# Patient Record
Sex: Female | Born: 2000 | Hispanic: No | Marital: Single | Smoking: Former smoker
Health system: Southern US, Community
[De-identification: ages and names within clinical notes are randomized; demographics above are authoritative.]

## PROBLEM LIST (undated history)

## (undated) DIAGNOSIS — R12 Heartburn: Secondary | ICD-10-CM

## (undated) DIAGNOSIS — J40 Bronchitis, not specified as acute or chronic: Secondary | ICD-10-CM

## (undated) DIAGNOSIS — F32A Depression, unspecified: Secondary | ICD-10-CM

## (undated) DIAGNOSIS — F1910 Other psychoactive substance abuse, uncomplicated: Secondary | ICD-10-CM

## (undated) DIAGNOSIS — K589 Irritable bowel syndrome without diarrhea: Secondary | ICD-10-CM

## (undated) DIAGNOSIS — F419 Anxiety disorder, unspecified: Secondary | ICD-10-CM

## (undated) DIAGNOSIS — J302 Other seasonal allergic rhinitis: Secondary | ICD-10-CM

## (undated) DIAGNOSIS — F329 Major depressive disorder, single episode, unspecified: Secondary | ICD-10-CM

## (undated) DIAGNOSIS — F909 Attention-deficit hyperactivity disorder, unspecified type: Secondary | ICD-10-CM

## (undated) HISTORY — DX: Heartburn: R12

## (undated) HISTORY — DX: Other psychoactive substance abuse, uncomplicated: F19.10

## (undated) HISTORY — DX: Depression, unspecified: F32.A

## (undated) HISTORY — PX: UPPER GASTROINTESTINAL ENDOSCOPY: SHX188

## (undated) HISTORY — DX: Anxiety disorder, unspecified: F41.9

## (undated) HISTORY — PX: WISDOM TOOTH EXTRACTION: SHX21

## (undated) HISTORY — DX: Major depressive disorder, single episode, unspecified: F32.9

## (undated) HISTORY — DX: Other seasonal allergic rhinitis: J30.2

## (undated) HISTORY — DX: Attention-deficit hyperactivity disorder, unspecified type: F90.9

## (undated) HISTORY — DX: Bronchitis, not specified as acute or chronic: J40

---
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
## 2018-01-09 ENCOUNTER — Ambulatory Visit (INDEPENDENT_AMBULATORY_CARE_PROVIDER_SITE_OTHER): Payer: Medicaid Other | Admitting: Internal Medicine

## 2018-01-09 ENCOUNTER — Encounter: Payer: Self-pay | Admitting: Internal Medicine

## 2018-01-09 ENCOUNTER — Encounter

## 2018-01-09 VITALS — BP 114/80 | HR 80 | Ht 65.0 in | Wt 259.1 lb

## 2018-01-09 DIAGNOSIS — R197 Diarrhea, unspecified: Secondary | ICD-10-CM | POA: Diagnosis not present

## 2018-01-09 DIAGNOSIS — R1013 Epigastric pain: Secondary | ICD-10-CM | POA: Diagnosis not present

## 2018-01-09 DIAGNOSIS — R630 Anorexia: Secondary | ICD-10-CM

## 2018-01-09 DIAGNOSIS — K625 Hemorrhage of anus and rectum: Secondary | ICD-10-CM | POA: Diagnosis not present

## 2018-01-09 NOTE — Patient Instructions (Signed)
  You have been scheduled for a colonoscopy. Please follow written instructions given to you at your visit today.  Please pick up your prep supplies at the pharmacy within the next 1-3 days. If you use inhalers (even only as needed), please bring them with you on the day of your procedure. Your physician has requested that you go to www.startemmi.com and enter the access code given to you at your visit today. This web site gives a general overview about your procedure. However, you should still follow specific instructions given to you by our office regarding your preparation for the procedure.   We are giving you samples to try of IBgard: take 2 capsules before meals. This is over the counter and we are giving you a coupon to use if you like it.   I appreciate the opportunity to care for you. Stan Head, MD, [facility]

## 2018-01-09 NOTE — Progress Notes (Addendum)
Renelda [place] 17 y.o. 02-02-2001 [id number]  Assessment & Plan:   Encounter Diagnoses  Name Primary?  . Diarrhea, unspecified type Yes  . Rectal bleeding   . Abdominal pain, epigastric   . Anorexia     Main ? Here is IBS vs IBD (Crohn's)  Hx and extensive record review undertaken -   I have recommended she undergo colonoscopy. Do not think other testing needed at this time. If colonoscopy negative (including random bxs liekly treat for IBS but consider capsule endoscopy Note that CT scan was done with IV but not oral contrast so bowel imaging less accurate  The risks and benefits as well as alternatives of endoscopic procedure(s) have been discussed and reviewed. All questions answered. The patient agrees to proceed.    Subjective:   Chief Complaint: diarrhea> constipation, nausea, rectal bleeding and abd pain  HPI 17 yo woman here w/ mom - has long hx GI problemns with nausea, vomiting and diarrhea sxs. Also rectal bleeding w/ wiping at times.10-14 stools./day - variable consistencies from watery to formed. No incontinence. Some nocturnal sxs - rare-uncommon. Having anorexia - using marijuana to help that.  Wt Readings from Last 3 Encounters:  01/09/18 259 lb 2 oz (117.5 kg) (>99 %, Z= 2.56)*  12/20/17 259 lb 14.8 oz (117.9 kg) (>99 %, Z= 2.57)*  02/14/17 260 lb (117.9 kg) (>99 %, Z= 2.52)*   * Growth percentiles are based on CDC (Girls, 2-20 Years) data.    Was in ED 11/2017  CT abd/pelvis 11/2017 - fatty liver o/w neg Labs 8 and 12/2017 CBC, CMET, UA, lipase - WBC 10-11, total protein sl high  05/2017 self harm visit - took Vyvanse - says she was trying to stay awake ansd not suicidal  Says since age [age] has had problems as above.  Records review after she left - 2016 - was in a group home then to try to help w/ ADHD and oppositiona d/o  CBC, CMET UA - gluc 169, o/w NL EGD 07/2014 - for heartburn, nausea, anorexia despite PPI  NL - NL rectal ane  heme neg then bxs - NL esophagus, stomach and duodenum Disaccharidase test - NL Tx Miralax, dicyclomine, omeprazole Sxs same as now though diarrhea seems worse now  Was on Tx for depression, ADHD (Celexa, Lamictal, Intuiv) No Known Allergies No outpatient medications have been marked as taking for the 01/09/18 encounter (Office Visit) with Iva Boop, MD.   Past Medical History:  Diagnosis Date  . ADHD   . Bronchitis   . Depression   . Heartburn   . IBS (irritable bowel syndrome)   . Seasonal allergies    Past Surgical History:  Procedure Laterality Date  . ESOPHAGOGASTRODUODENOSCOPY  2016   Normal  . 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 ENT:   Mouth and posterior pharynx free of lesions.  Neck:   supple w/o thyromegaly or mass.  Lungs: Clear to auscultation bilaterally. Heart:  S1S2, no rubs, murmurs, gallops. Abdomen:  soft, non-tender, no hepatosplenomegaly, hernia, or mass and BS+.  Rectal: deferred Lymph:  no cervical or supraclavicular adenopathy. Extremities:   no edema, cyanosis or clubbing Skin   no rash. Neuro:  A&O x 3.  Psych:  appropriate mood and  Affect.   Data Reviewed: See HPI I have personaly viewed CT inages from 11/2017

## 2018-01-12 ENCOUNTER — Encounter: Payer: Self-pay | Admitting: Internal Medicine

## 2018-01-14 HISTORY — PX: GIVENS CAPSULE STUDY: SHX5432

## 2018-01-26 ENCOUNTER — Telehealth: Payer: Self-pay | Admitting: Gastroenterology

## 2018-01-26 NOTE — Telephone Encounter (Signed)
Called by patient around 9 PM. She describes having a sensation over the course of today of generalized fatigue and not feeling herself completely.  She had already started her preparation earlier this afternoon and has began to have bowel movements.  She took her temperature and it was noted to be 100.2.  She describes having some URI-like symptoms however is breathing fine and not having significant amount of cough or any sort of sputum production. Her colonoscopy is being done for evaluation of recurrent/chronic diarrhea. I spoke with her and she is not having any other symptoms that would require urgent stoppage of her preparation.  I told her that based on how she feels over the course the next few hours and then early in the morning when she is taking the early morning preparation if she were not to feel well or did not feel that she could tolerate completion of the preparation then I would stop and she should let our nurses know in the morning (I told her we would also send this message to Dr. Leone Payor as well so that his team would be able to reach out in the morning (to consider moving forward with colonoscopy or not.  She feels otherwise her normal self and think she will be able to continue the preparation.  I told her that that seems reasonable but if she continued to have low-grade fevers or true fevers that then there is a chance we would probably hold on a colonoscopy tomorrow.  She also understands that if her vital signs or hemodynamics suggested any inability to pursue even if she was prepped there is a chance she could be canceled.  She understood this and appreciated the call back.  She will alert [place] or Dr. Leone Payor and his staff in the morning as to whether she feels she cannot come in for her procedure she is plan for 4 PM.  She continues to feel unwell may be reasonable to head to urgent care in the morning or her primary care physician.   Corliss Parish, MD [place]  [facility] Office # [phone]

## 2018-01-27 ENCOUNTER — Encounter: Payer: Self-pay | Admitting: Internal Medicine

## 2018-01-27 ENCOUNTER — Ambulatory Visit (AMBULATORY_SURGERY_CENTER): Payer: Medicaid Other | Admitting: Internal Medicine

## 2018-01-27 ENCOUNTER — Other Ambulatory Visit: Payer: Self-pay

## 2018-01-27 VITALS — BP 110/68 | HR 78 | Temp 97.1°F | Resp 17 | Ht 65.0 in | Wt 259.0 lb

## 2018-01-27 DIAGNOSIS — K5289 Other specified noninfective gastroenteritis and colitis: Secondary | ICD-10-CM | POA: Diagnosis not present

## 2018-01-27 DIAGNOSIS — K621 Rectal polyp: Secondary | ICD-10-CM

## 2018-01-27 DIAGNOSIS — R197 Diarrhea, unspecified: Secondary | ICD-10-CM

## 2018-01-27 DIAGNOSIS — K529 Noninfective gastroenteritis and colitis, unspecified: Secondary | ICD-10-CM | POA: Diagnosis not present

## 2018-01-27 MED ORDER — SODIUM CHLORIDE 0.9 % IV SOLN: 500.0000 mL | Freq: Once | INTRAVENOUS | Status: DC

## 2018-01-27 NOTE — Progress Notes (Signed)
Report to PACU, RN, vss, BBS= Clear.  

## 2018-01-27 NOTE — Progress Notes (Signed)
Called to room to assist during endoscopic procedure.  Patient ID and intended procedure confirmed with present staff. Received instructions for my participation in the procedure from the performing physician.  

## 2018-01-27 NOTE — Progress Notes (Signed)
Smokes marijuana daily, used some this am.

## 2018-01-27 NOTE — Patient Instructions (Addendum)
Things look ok - sometimes there are problems that can only be seen under the microscope so I took biopsies. There was a tiny poly also - I do not think it is/was a problem and it has been removed.  We will contact you with results and plans when the biopsy results return.  I appreciate the opportunity to care for you. Iva Boop, MD, FACG    YOU HAD AN ENDOSCOPIC PROCEDURE TODAY AT THE [facility]:   Refer to the procedure report that was given to you for any specific questions about what was found during the examination.  If the procedure report does not answer your questions, please call your gastroenterologist to clarify.  If you requested that your care partner not be given the details of your procedure findings, then the procedure report has been included in a sealed envelope for you to review at your convenience later.  YOU SHOULD EXPECT: Some feelings of bloating in the abdomen. Passage of more gas than usual.  Walking can help get rid of the air that was put into your GI tract during the procedure and reduce the bloating. If you had a lower endoscopy (such as a colonoscopy or flexible sigmoidoscopy) you may notice spotting of blood in your stool or on the toilet paper. If you underwent a bowel prep for your procedure, you may not have a normal bowel movement for a few days.  Please Note:  You might notice some irritation and congestion in your nose or some drainage.  This is from the oxygen used during your procedure.  There is no need for concern and it should clear up in a day or so.  SYMPTOMS TO REPORT IMMEDIATELY:   Following lower endoscopy (colonoscopy or flexible sigmoidoscopy):  Excessive amounts of blood in the stool  Significant tenderness or worsening of abdominal pains  Swelling of the abdomen that is new, acute  Fever of 100F or higher  For urgent or emergent issues, a gastroenterologist can be reached at any hour by calling [phone]  [phone].   DIET:  We do recommend a small meal at first, but then you may proceed to your regular diet.  Drink plenty of fluids but you should avoid alcoholic beverages for 24 hours.  ACTIVITY:  You should plan to take it easy for the rest of today and you should NOT DRIVE or use heavy machinery until tomorrow (because of the sedation medicines used during the test).    FOLLOW UP: Our staff will call the number listed on your records the next business day following your procedure to check on you and address any questions or concerns that you may have regarding the information given to you following your procedure. If we do not reach you, we will leave a message.  However, if you are feeling well and you are not experiencing any problems, there is no need to return our call.  We will assume that you have returned to your regular daily activities without incident.  If any biopsies were taken you will be contacted by phone or by letter within the next 1-3 weeks.  Please call us at [phone] if you have not heard about the biopsies in 3 weeks.    SIGNATURES/CONFIDENTIALITY: You and/or your care partner have signed paperwork which will be entered into your electronic medical record.  These signatures attest to the fact that that the information above on your After Visit Summary has been reviewed and is understood.  Full responsibility of the confidentiality of this discharge information lies with you and/or your care-partner.    Handout was given to your care partner on polyps. You may resume your current medications today. Await biopsy results. Please call if any questions or concerns.

## 2018-01-27 NOTE — Progress Notes (Signed)
No problems noted in the recovery room. maw 

## 2018-01-27 NOTE — Telephone Encounter (Signed)
Patient reports that she is feeling much better.  She has no fever this am.  She is about to do the second part of her prep.  She would like to keep the appt for this afternoon.  She will call if anything changes.

## 2018-01-27 NOTE — Telephone Encounter (Signed)
We will contact the patient today for follow-up and decide whether to proceed

## 2018-01-27 NOTE — Op Note (Signed)
[facility] Patient Name: Sandra Drake Procedure Date: 01/27/2018 3:58 PM MRN: [id number] Endoscopist: Iva Boop , MD Age: 17 Referring MD:  Date of Birth: Aug 24, 2000 Gender: Female Account #: [id number] Procedure:                Colonoscopy Indications:              Clinically significant diarrhea of unexplained                            origin, Rectal bleeding Medicines:                Propofol per Anesthesia, Monitored Anesthesia Care Procedure:                Pre-Anesthesia Assessment:                           - Prior to the procedure, a History and Physical                            was performed, and patient medications and                            allergies were reviewed. The patient's tolerance of                            previous anesthesia was also reviewed. The risks                            and benefits of the procedure and the sedation                            options and risks were discussed with the patient.                            All questions were answered, and informed consent                            was obtained. Prior Anticoagulants: The patient has                            taken no previous anticoagulant or antiplatelet                            agents. ASA Grade Assessment: III - A patient with                            severe systemic disease. After reviewing the risks                            and benefits, the patient was deemed in                            satisfactory condition to undergo the procedure.  After obtaining informed consent, the colonoscope                            was passed under direct vision. Throughout the                            procedure, the patient's blood pressure, pulse, and                            oxygen saturations were monitored continuously. The                            Model CF-HQ190L [id number]) scope was introduced                            through  the anus and advanced to the the terminal                            ileum, with identification of the appendiceal                            orifice and IC valve. The colonoscopy was performed                            without difficulty. The patient tolerated the                            procedure well. The quality of the bowel                            preparation was excellent. The terminal ileum,                            ileocecal valve, appendiceal orifice, and rectum                            were photographed. The bowel preparation used was                            Miralax. Scope In: 4:10:07 PM Scope Out: 4:20:53 PM Scope Withdrawal Time: 0 hours 8 minutes 28 seconds  Total Procedure Duration: 0 hours 10 minutes 46 seconds  Findings:                 The perianal and digital rectal examinations were                            normal.                           The terminal ileum appeared normal. Biopsies were                            taken with a cold forceps for histology.  Verification of patient identification for the                            specimen was done. Estimated blood loss was minimal.                           A 2 mm polyp was found in the rectum. The polyp was                            flat. The polyp was removed with a cold biopsy                            forceps. Resection and retrieval were complete.                            Verification of patient identification for the                            specimen was done. Estimated blood loss was minimal.                           The exam was otherwise without abnormality on                            direct and retroflexion views.                           Biopsies for histology were taken with a cold                            forceps from the right colon and left colon for                            evaluation of microscopic colitis. Complications:            No immediate  complications. Estimated Blood Loss:     Estimated blood loss was minimal. Impression:               - The examined portion of the ileum was normal.                            Biopsied.                           - One 2 mm polyp in the rectum, removed with a cold                            biopsy forceps. Resected and retrieved.                           - The examination was otherwise normal on direct                            and retroflexion views.                           -  Biopsies were taken with a cold forceps from the                            right colon and left colon for evaluation of                            microscopic colitis. Recommendation:           - Patient has a contact number available for                            emergencies. The signs and symptoms of potential                            delayed complications were discussed with the                            patient. Return to normal activities tomorrow.                            Written discharge instructions were provided to the                            patient.                           - Resume previous diet.                           - Continue present medications.                           - Repeat colonoscopy is recommended. The                            colonoscopy date will be determined after pathology                            results from today's exam become available for                            review. Iva Boop, MD 01/27/2018 4:29:43 PM This report has been signed electronically.

## 2018-01-28 ENCOUNTER — Telehealth: Payer: Self-pay | Admitting: *Deleted

## 2018-01-28 NOTE — Telephone Encounter (Signed)
  Follow up Call-  Call back number 01/27/2018  Post procedure Call Back phone  # [phone]  Permission to leave phone message Yes     No answer at # given.  LM on VM.

## 2018-01-28 NOTE — Telephone Encounter (Signed)
  Follow up Call-  Call back number 01/27/2018  Post procedure Call Back phone  # [phone]  Permission to leave phone message Yes     Patient questions:  Do you have a fever, pain , or abdominal swelling? No. Pain Score  0 *  Have you tolerated food without any problems? Yes.    Have you been able to return to your normal activities? Yes.    Do you have any questions about your discharge instructions: Diet   No. Medications  No. Follow up visit  No.  Do you have questions or concerns about your Care? No.  Actions: * If pain score is 4 or above: No action needed, pain <4.

## 2018-02-04 NOTE — Progress Notes (Signed)
Office Please let patient and/or mom know that the biopsies of the small intestine show inflammation.  That by itself does not tell us what is causing the inflammation but it could be an autoimmune disease called Crohn's disease  I recommend  1) Capsule endoscopy - dx diarrhea, ileitis, blood in stool 2) IBD serology

## 2018-02-05 ENCOUNTER — Other Ambulatory Visit: Payer: Self-pay

## 2018-02-05 DIAGNOSIS — K625 Hemorrhage of anus and rectum: Secondary | ICD-10-CM

## 2018-02-05 DIAGNOSIS — R197 Diarrhea, unspecified: Secondary | ICD-10-CM

## 2018-02-05 DIAGNOSIS — K529 Noninfective gastroenteritis and colitis, unspecified: Secondary | ICD-10-CM

## 2018-02-05 NOTE — Addendum Note (Signed)
Addended by: Annett Fabian on: 02/05/2018 03:19 PM   Modules accepted: Orders

## 2018-02-05 NOTE — Progress Notes (Signed)
.  ibd

## 2018-02-10 ENCOUNTER — Ambulatory Visit (INDEPENDENT_AMBULATORY_CARE_PROVIDER_SITE_OTHER): Payer: Medicaid Other | Admitting: Internal Medicine

## 2018-02-10 ENCOUNTER — Encounter: Payer: Self-pay | Admitting: Internal Medicine

## 2018-02-10 DIAGNOSIS — K625 Hemorrhage of anus and rectum: Secondary | ICD-10-CM

## 2018-02-10 DIAGNOSIS — R197 Diarrhea, unspecified: Secondary | ICD-10-CM | POA: Diagnosis not present

## 2018-02-10 NOTE — Progress Notes (Signed)
  Patient here for CapsoCam Plus capsule endoscopy. Serial # [id number] Tolerated procedure. Verbalizes understanding of written and verbal instructions.

## 2018-02-19 ENCOUNTER — Telehealth: Payer: Self-pay | Admitting: Internal Medicine

## 2018-02-19 NOTE — Telephone Encounter (Signed)
Patient states she is waiting on her capsule endo results from 10.28.19, but she has also been vomiting everyday and would like advice for what she could do.

## 2018-02-19 NOTE — Telephone Encounter (Signed)
Patient reports that she is having vomiting 2- 3 times a week. Usually happens in the am, while having a BM, or at night.  Can't pinpoint any triggers for the vomiting. She is requesting something for the nausea.  She understands you are out of town and I will call her once you have had an opportunity to review.

## 2018-02-24 MED ORDER — ONDANSETRON 4 MG PO TBDP: ORAL_TABLET | ORAL | 1 refills | Status: DC

## 2018-02-24 NOTE — Telephone Encounter (Signed)
Patient notified rx sent EGD and pre-visit arranged

## 2018-02-24 NOTE — Telephone Encounter (Signed)
1) ondansetron - orally disintegrating table 4 mg 1-2 every 4 hrs prn nausea and vomiting # 20 1 RF 2) Capsule endo shows retained material vs inflammation in the stomach - she had an EGD in 2016 that was ok but we need to repeat 3) Schedule EGD in [facility] - dx: persistent vomiting, diarrhea

## 2018-02-24 NOTE — Telephone Encounter (Signed)
Left message for patient to call back  

## 2018-03-11 ENCOUNTER — Ambulatory Visit (AMBULATORY_SURGERY_CENTER): Payer: Self-pay | Admitting: *Deleted

## 2018-03-11 ENCOUNTER — Telehealth: Payer: Self-pay | Admitting: *Deleted

## 2018-03-11 ENCOUNTER — Other Ambulatory Visit: Payer: Self-pay | Admitting: Internal Medicine

## 2018-03-11 VITALS — Ht 65.0 in | Wt 257.6 lb

## 2018-03-11 DIAGNOSIS — R110 Nausea: Secondary | ICD-10-CM

## 2018-03-11 DIAGNOSIS — R112 Nausea with vomiting, unspecified: Secondary | ICD-10-CM

## 2018-03-11 DIAGNOSIS — R197 Diarrhea, unspecified: Secondary | ICD-10-CM

## 2018-03-11 MED ORDER — ONDANSETRON 4 MG PO TBDP: ORAL_TABLET | ORAL | 1 refills | Status: DC

## 2018-03-11 NOTE — Progress Notes (Signed)
No egg or soy allergy  No trouble moving neck  No diet medications taken  No home oxygen used or hx of sleep apnea  No chewing tobacco/snuff used

## 2018-03-11 NOTE — Telephone Encounter (Signed)
LMOM that Dr. Leone PayorGessner has refilled her Zofran

## 2018-03-11 NOTE — Telephone Encounter (Signed)
Dr. Leone PayorGessner,  This pt is here for her PV- her EGD is 03-17-18.  She states that the Zofran that has been prescribed to her is helping but she is out.  Can she get a refill for this/  Thanks, Baxter HireKristen

## 2018-03-11 NOTE — Telephone Encounter (Signed)
I refilled it if you will let her know

## 2018-03-17 ENCOUNTER — Encounter: Payer: Self-pay | Admitting: Internal Medicine

## 2018-03-17 ENCOUNTER — Ambulatory Visit (AMBULATORY_SURGERY_CENTER): Payer: Medicaid Other | Admitting: Internal Medicine

## 2018-03-17 VITALS — BP 123/68 | HR 81 | Temp 97.8°F | Resp 22 | Ht 65.0 in | Wt 257.0 lb

## 2018-03-17 DIAGNOSIS — R110 Nausea: Secondary | ICD-10-CM

## 2018-03-17 DIAGNOSIS — R1115 Cyclical vomiting syndrome unrelated to migraine: Secondary | ICD-10-CM

## 2018-03-17 DIAGNOSIS — R197 Diarrhea, unspecified: Secondary | ICD-10-CM

## 2018-03-17 MED ORDER — SODIUM CHLORIDE 0.9 % IV SOLN: 500.0000 mL | Freq: Once | INTRAVENOUS | Status: DC

## 2018-03-17 NOTE — Patient Instructions (Addendum)
This test was normal which is good news.  We need to sit down in the office and review things and see what else might be done.  Please call and schedule an appointment.  Since the ondansetron is helping stay on that.  I appreciate the opportunity to care for you. Iva Booparl E. Amaryah Mallen, MD, FACG  YOU HAD AN ENDOSCOPIC PROCEDURE TODAY AT THE [facility]:   Refer to the procedure report that was given to you for any specific questions about what was found during the examination.  If the procedure report does not answer your questions, please call your gastroenterologist to clarify.  If you requested that your care partner not be given the details of your procedure findings, then the procedure report has been included in a sealed envelope for you to review at your convenience later.  YOU SHOULD EXPECT: Some feelings of bloating in the abdomen. Passage of more gas than usual.  Walking can help get rid of the air that was put into your GI tract during the procedure and reduce the bloating. If you had a lower endoscopy (such as a colonoscopy or flexible sigmoidoscopy) you may notice spotting of blood in your stool or on the toilet paper. If you underwent a bowel prep for your procedure, you may not have a normal bowel movement for a few days.  Please Note:  You might notice some irritation and congestion in your nose or some drainage.  This is from the oxygen used during your procedure.  There is no need for concern and it should clear up in a day or so.  SYMPTOMS TO REPORT IMMEDIATELY:   Following upper endoscopy (EGD)  Vomiting of blood or coffee ground material  New chest pain or pain under the shoulder blades  Painful or persistently difficult swallowing  New shortness of breath  Fever of 100F or higher  Black, tarry-looking stools  For urgent or emergent issues, a gastroenterologist can be reached at any hour by calling [phone].   DIET:  We do recommend a small  meal at first, but then you may proceed to your regular diet.  Drink plenty of fluids but you should avoid alcoholic beverages for 24 hours.  ACTIVITY:  You should plan to take it easy for the rest of today and you should NOT DRIVE or use heavy machinery until tomorrow (because of the sedation medicines used during the test).    FOLLOW UP: Our staff will call the number listed on your records the next business day following your procedure to check on you and address any questions or concerns that you may have regarding the information given to you following your procedure. If we do not reach you, we will leave a message.  However, if you are feeling well and you are not experiencing any problems, there is no need to return our call.  We will assume that you have returned to your regular daily activities without incident.  If any biopsies were taken you will be contacted by phone or by letter within the next 1-3 weeks.  Please call us at [phone] if you have not heard about the biopsies in 3 weeks.    SIGNATURES/CONFIDENTIALITY: You and/or your care partner have signed paperwork which will be entered into your electronic medical record.  These signatures attest to the fact that that the information above on your After Visit Summary has been reviewed and is understood.  Full responsibility of the confidentiality of this discharge  information lies with you and/or your care-partner. 

## 2018-03-17 NOTE — Op Note (Addendum)
[facility] Patient Name: Sandra Drake Procedure Date: 03/17/2018 10:24 AM MRN: [id number] Endoscopist: Iva Boop , MD Age: 17 Referring MD:  Date of Birth: 2000/11/29 Gender: Female Account #: [id number] Procedure:                Upper GI endoscopy Indications:              Nausea, Persistent vomiting, Persistent vomiting of                            unknown cause, ? heme in stomach on capsule                            endoscopy + some retained food Medicines:                Propofol per Anesthesia, Monitored Anesthesia Care Procedure:                Pre-Anesthesia Assessment:                           - Prior to the procedure, a History and Physical                            was performed, and patient medications and                            allergies were reviewed. The patient's tolerance of                            previous anesthesia was also reviewed. The risks                            and benefits of the procedure and the sedation                            options and risks were discussed with the patient.                            All questions were answered, and informed consent                            was obtained. Prior Anticoagulants: The patient has                            taken no previous anticoagulant or antiplatelet                            agents. ASA Grade Assessment: II - A patient with                            mild systemic disease. After reviewing the risks                            and benefits, the patient was deemed in  satisfactory condition to undergo the procedure.                           After obtaining informed consent, the endoscope was                            passed under direct vision. Throughout the                            procedure, the patient's blood pressure, pulse, and                            oxygen saturations were monitored continuously. The                             Endoscope was introduced through the mouth, and                            advanced to the second part of duodenum. The upper                            GI endoscopy was accomplished without difficulty.                            The patient tolerated the procedure well. Scope In: Scope Out: Findings:                 The esophagus was normal.                           The stomach was normal.                           The examined duodenum was normal.                           The cardia and gastric fundus were normal on                            retroflexion. Complications:            No immediate complications. Estimated Blood Loss:     Estimated blood loss: none. Impression:               - Normal esophagus.                           - Normal stomach.                           - Normal examined duodenum.                           - No specimens collected. Recommendation:           - Patient has a contact number available for  emergencies. The signs and symptoms of potential                            delayed complications were discussed with the                            patient. Return to normal activities tomorrow.                            Written discharge instructions were provided to the                            patient.                           - Resume previous diet.                           - Continue present medications.                           - ondansetron seems to help her.                           need office visit to discuss other treatment.                           Balance of evdience suggests IBS, functional GI                            disturbances. In recovery said she was still having                            diarrhea - "especially when I do cocaine" - ? if                            joking or truthful - effects of sedatives? Iva Boop, MD 03/17/2018 10:44:02 AM This report has been signed electronically.

## 2018-03-17 NOTE — Progress Notes (Signed)
To recovery, report to RN, VSS. 

## 2018-03-17 NOTE — Progress Notes (Signed)
Pt's states no medical or surgical changes since previsit or office visit. 

## 2018-03-18 ENCOUNTER — Telehealth: Payer: Self-pay | Admitting: *Deleted

## 2018-03-18 NOTE — Telephone Encounter (Signed)
Second attempt, left VM.  

## 2018-03-18 NOTE — Telephone Encounter (Signed)
Left message on f/u call 

## 2018-04-07 ENCOUNTER — Telehealth: Payer: Self-pay | Admitting: Internal Medicine

## 2018-04-07 NOTE — Telephone Encounter (Signed)
Pt is requesting a letter for school [employer] explaining her health conditions for the past 4 months, letter needs to mention procedures and treatments that she has done, she needs letter no latter than Jan 1st, 2020.

## 2018-04-07 NOTE — Telephone Encounter (Signed)
I left a message that Dr. Leone PayorGessner is out of the office and may not be able to get the letter ready in her time frame.  We will let her know when the letter is ready

## 2018-04-10 NOTE — Telephone Encounter (Signed)
Explained to pt that Dr. Leone PayorGessner needs to write the letter as he made the diagnosis and did the treatment. Pt wanted a letter stating that he is out of the office and will not return to work until 04/15/18. Letter sent to pt via mychart. Needs letter as stated in message below asap when Dr. Leone PayorGessner returns.

## 2018-04-10 NOTE — Telephone Encounter (Signed)
Pt called inquiring if another doctor can write a letter for her since Dr. Leone PayorGessner is not available. Pls call pt.

## 2018-04-14 ENCOUNTER — Encounter: Payer: Self-pay | Admitting: Internal Medicine

## 2018-04-17 NOTE — Telephone Encounter (Signed)
See information in bottom of phone note for details.  Patient needs a letter for school

## 2018-04-17 NOTE — Telephone Encounter (Signed)
I have spoken to [facility] and she will come tomorrow , sign the form and I will fax it to [phone] [facility] Financial Aid Office) and then I'll send it to be scanned into epic.

## 2018-04-17 NOTE — Telephone Encounter (Signed)
Pt says that she left a form for Dr. Leone Payor to complete reiterating her diagnosis and treatment.  Pt needs letter for [employer]

## 2018-04-17 NOTE — Telephone Encounter (Signed)
She has the letter There is also  form she left when she picked up the letter that will be ready tomorrow

## 2018-04-23 NOTE — Telephone Encounter (Signed)
I called and left Sandra Drake a voicemail reminding her to come sign the form.

## 2018-04-28 NOTE — Telephone Encounter (Signed)
Pt has an office visit 04/30/2018 we will get her to sign the form then.

## 2018-04-30 ENCOUNTER — Ambulatory Visit: Payer: Medicaid Other | Admitting: Internal Medicine

## 2018-04-30 NOTE — Telephone Encounter (Signed)
I spoke with [facility] and she said she doesn't need the school form anymore as she got suspended. She has r/s'ed her appointment that was today.

## 2018-06-03 ENCOUNTER — Ambulatory Visit: Payer: Medicaid Other | Admitting: Internal Medicine

## 2019-07-27 ENCOUNTER — Ambulatory Visit (INDEPENDENT_AMBULATORY_CARE_PROVIDER_SITE_OTHER)
Admission: RE | Admit: 2019-07-27 | Discharge: 2019-07-27 | Disposition: A | Discharge status: Home / Self Care | Payer: Medicaid Other | Source: Ambulatory Visit

## 2019-07-27 DIAGNOSIS — M79642 Pain in left hand: Secondary | ICD-10-CM

## 2019-07-27 MED ORDER — IBUPROFEN 600 MG PO TABS: 600.0000 mg | ORAL_TABLET | Freq: Four times a day (QID) | ORAL | 0 refills | Status: DC | PRN

## 2019-07-27 NOTE — Discharge Instructions (Addendum)
Follow up with Orthopedics; call to schedule an appointment.    Take the ibuprofen as directed.

## 2019-07-27 NOTE — ED Provider Notes (Signed)
Virtual Visit via Video Note:  [place]  initiated request for Telemedicine visit with [facility] Urgent [facility] team. I connected with [facility]  on 07/27/2019 at 9:49 AM  for a synchronized telemedicine visit using a video enabled HIPPA compliant telemedicine application. I verified that I am speaking with [facility]  using two identifiers. Sharion Balloon, NP  was physically located in a [facility] Urgent care site and [facility] was located at a different location.   The limitations of evaluation and management by telemedicine as well as the availability of in-person appointments were discussed. Patient was informed that she  may incur a bill ( including co-pay) for this virtual visit encounter. Sandra Drake  expressed understanding and gave verbal consent to proceed with virtual visit.     History of Present Illness:Sandra Drake  is a 19 y.o. female presents for evaluation of "lump" on left hand x 2-3 weeks.  No known injury.  She feels like her bones are rubbing together and that the lump is popping in and out.  She denies numbness, tingling, weakness, or other symptoms.  No open wounds or rash.  She denies pregnancy or breastfeeding.      No Known Allergies   Past Medical History:  Diagnosis Date  . ADHD   . Anxiety   . Bronchitis   . Depression   . Heartburn   . IBS (irritable bowel syndrome)   . Seasonal allergies   . Substance abuse ([place])      Social History   Tobacco Use  . Smoking status: Current Every Day Smoker    Types: Cigarettes  . Smokeless tobacco: Never Used  . Tobacco comment: spokes a cigarette once a week  Substance Use Topics  . Alcohol use: Yes    Comment: occasional wine  . Drug use: Yes    Types: Marijuana    Comment: daily   ROS: as stated in HPI.  All other systems reviewed and negative.      Observations/Objective: Physical Exam  VITALS: Patient denies fever. GENERAL:  Alert, appears well and in no acute distress. HEENT: Atraumatic. NECK: Normal movements of the head and neck. CARDIOPULMONARY: No increased WOB. Speaking in clear sentences. I:E ratio WNL.  MS: Moves all visible extremities without noticeable abnormality. PSYCH: Pleasant and cooperative, well-groomed. Speech normal rate and rhythm. Affect is appropriate. Insight and judgement are appropriate. Attention is focused, linear, and appropriate.  NEURO: CN grossly intact. Oriented as arrived to appointment on time with no prompting. Moves both UE equally.  SKIN: Limited ability to see on video but appears to have a small round mass on the back of her left hand.    Assessment and Plan:    ICD-10-CM   1. Pain of left hand  M79.642        Follow Up Instructions: This may be a ganglion cyst.  Treating with ibuprofen for discomfort.  Patient does not have a PCP.  Instructed patient to see orthopedics for possible x-ray and for evaluation.  Patient agrees with plan of care.    I discussed the assessment and treatment plan with the patient. The patient was provided an opportunity to ask questions and all were answered. The patient agreed with the plan and demonstrated an understanding of the instructions.   The patient was advised to call back or seek an in-person evaluation if the symptoms worsen or if the condition fails to improve as anticipated.  Mickie Bail, NP  07/27/2019 9:49 AM         Mickie Bail, NP 07/27/19 [phone]

## 2019-08-27 IMAGING — CT CT ABD-PELV W/ CM
2 of 4 series · 17 of 46 positions shown, 19 images · IV contrast (Omni 300)
Comparison: None.

CLINICAL DATA: Abdominal pain.

EXAM:
CT ABDOMEN AND PELVIS WITH CONTRAST
TECHNIQUE: Multidetector CT imaging of the abdomen and pelvis was performed
using the standard protocol following bolus administration of
intravenous contrast.
CONTRAST:  100mL JB3KCL-S22 IOPAMIDOL (JB3KCL-S22) INJECTION 61%

[Series 3: a/p w/ 5mm · axial · 0.98mm/px · z∈[+575,+1070]mm · 14 of 109 slices shown, 16 images]
[im 5/109  soft-tissue]
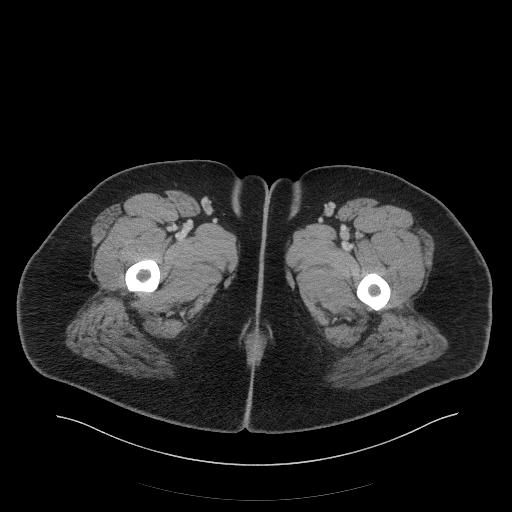
[im 5/109  bone]
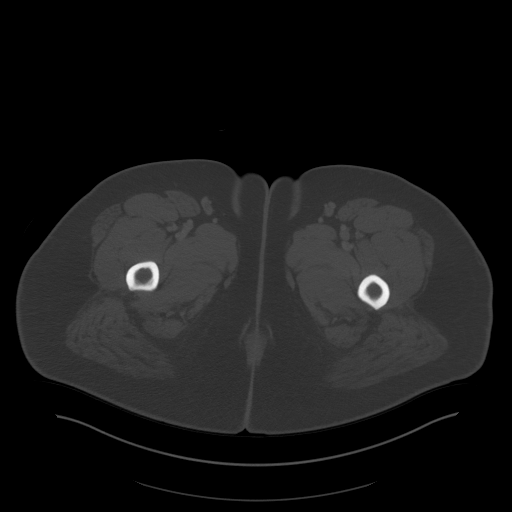
[im 13/109  soft-tissue]
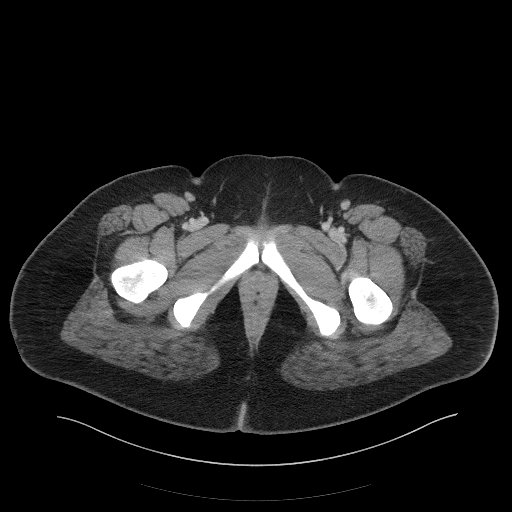
[im 22/109  soft-tissue]
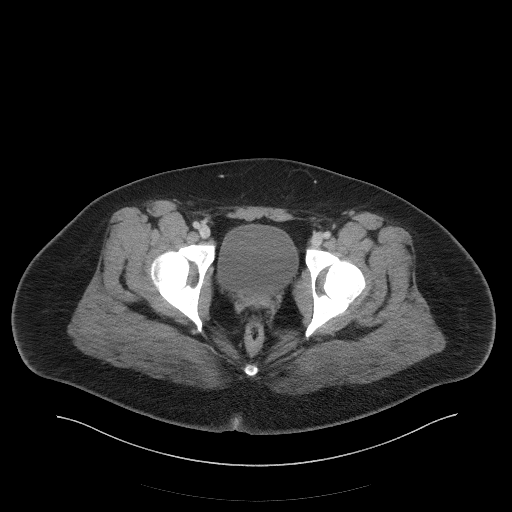
[im 31/109  soft-tissue]
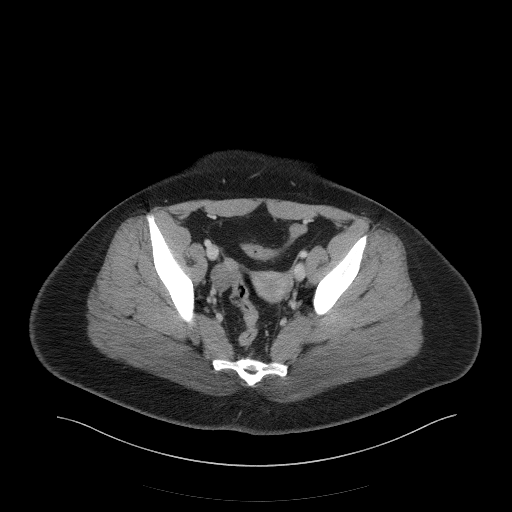
[im 35/109  soft-tissue]
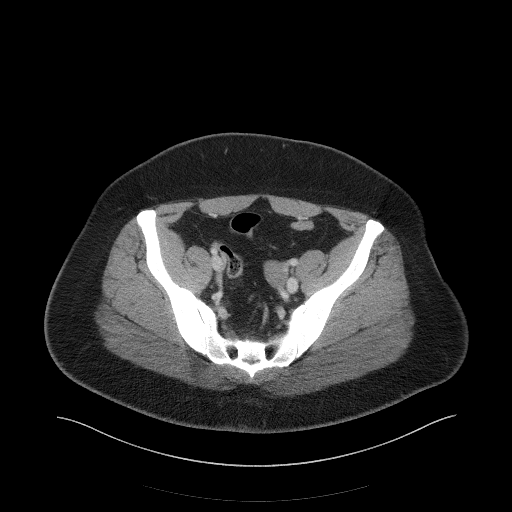
[im 44/109  soft-tissue]
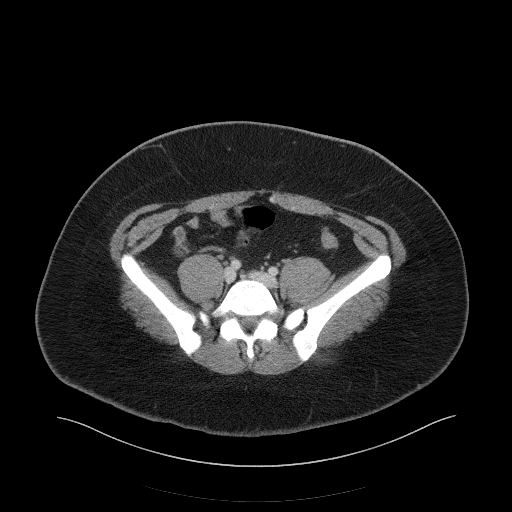
[im 52/109  soft-tissue]
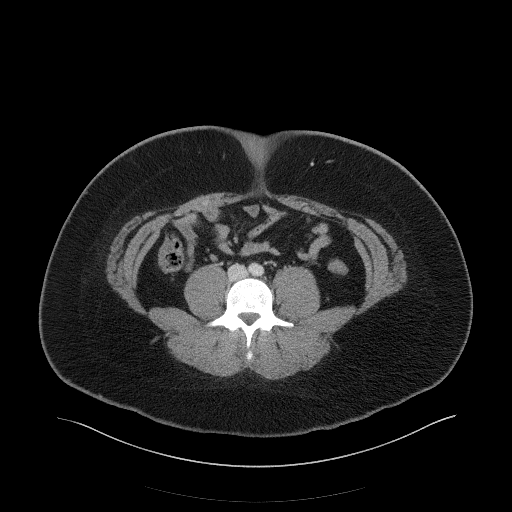
[im 57/109  soft-tissue]
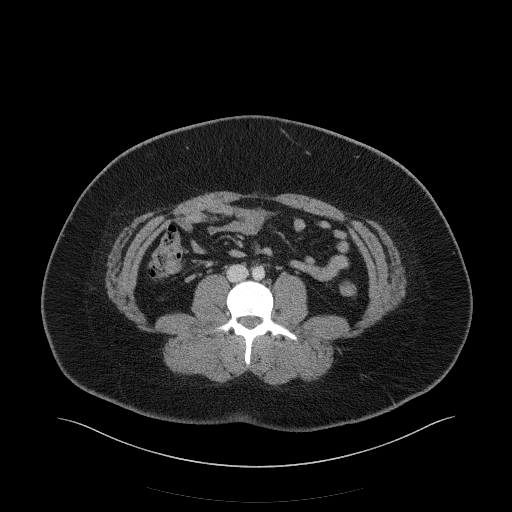
[im 65/109  soft-tissue]
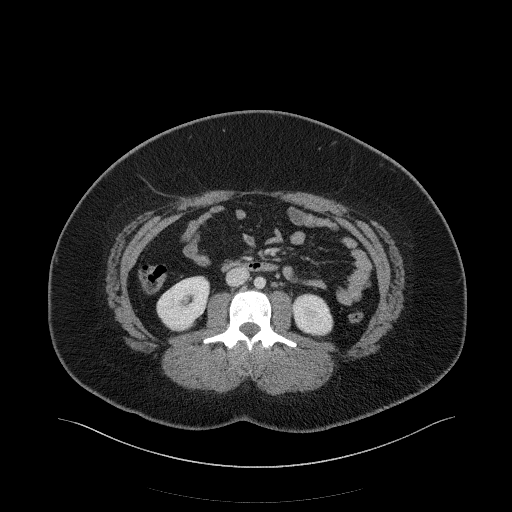
[im 65/109  bone]
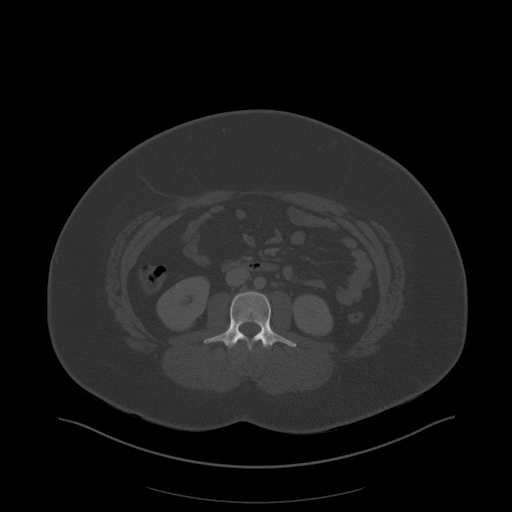
[im 74/109  soft-tissue]
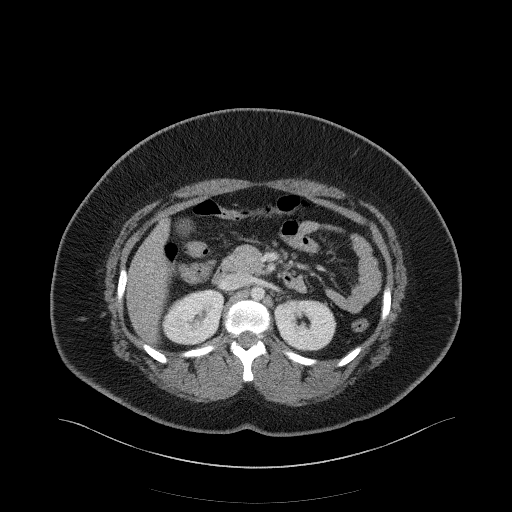
[im 83/109  soft-tissue]
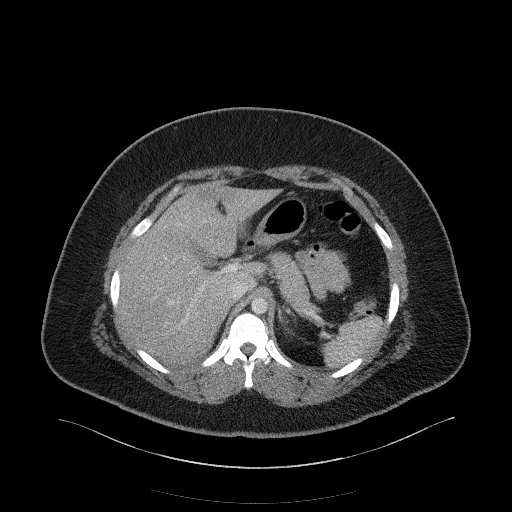
[im 87/109  soft-tissue]
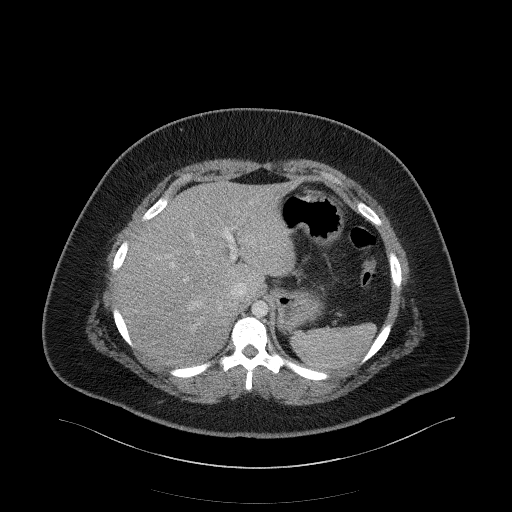
[im 96/109  soft-tissue]
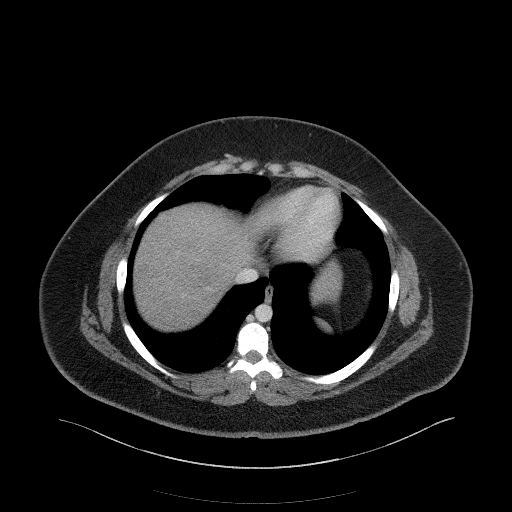
[im 104/109  soft-tissue]
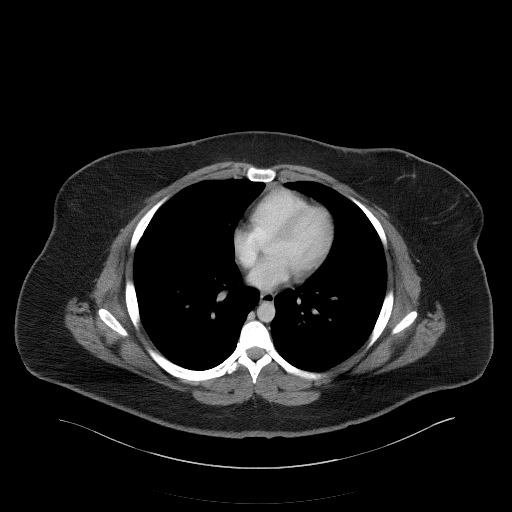

[Series 6: a/p w/ cor · coronal · 0.89mm/px · 3 of 173 slices shown]
[im 58/173  soft-tissue]
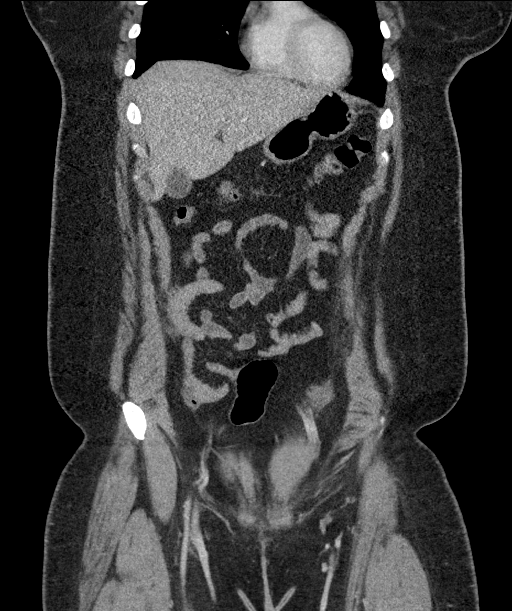
[im 77/173  soft-tissue]
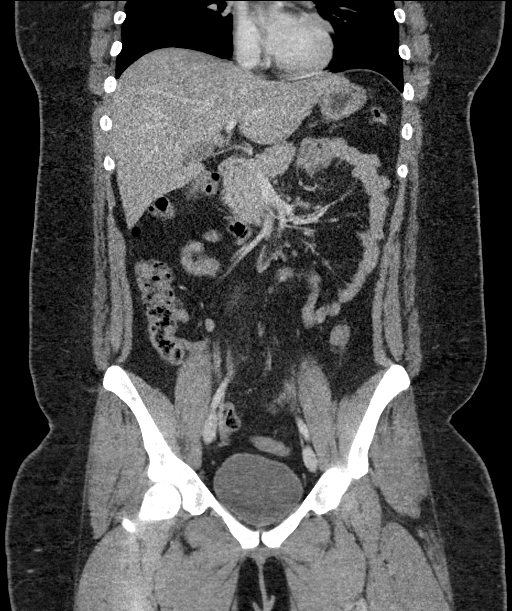
[im 96/173  soft-tissue]
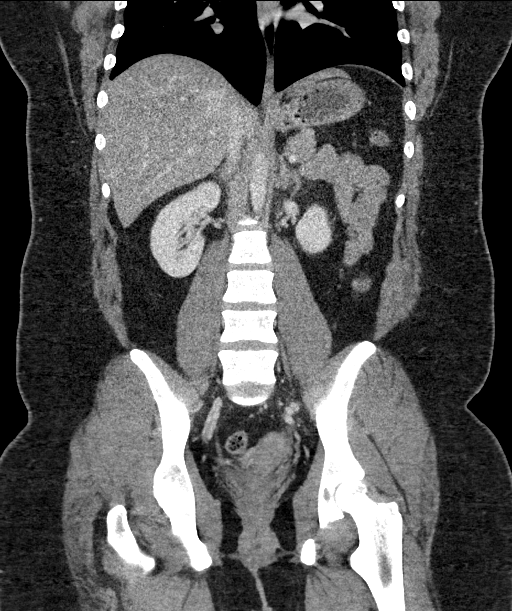

[17 of 46 positions shown; findings below may reference images not displayed]

FINDINGS: Lower chest: Lung bases are clear. No effusions. Heart is normal
size.

Hepatobiliary: Diffuse low-density throughout the liver compatible
with fatty infiltration. No focal abnormality. Gallbladder
unremarkable.

Pancreas: No focal abnormality or ductal dilatation.

Spleen: No focal abnormality.  Normal size.

Adrenals/Urinary Tract: No adrenal abnormality. No focal renal
abnormality. No stones or hydronephrosis. Urinary bladder is
unremarkable.

Stomach/Bowel: Normal appendix. Stomach, large and small bowel
grossly unremarkable.

Vascular/Lymphatic: No evidence of aneurysm or adenopathy.

Reproductive: Uterus and adnexa unremarkable.  No mass.

Other: No free fluid or free air.

Musculoskeletal: No acute bony abnormality.
IMPRESSION: No acute findings in the abdomen or pelvis.  Normal appendix.

Fatty infiltration of the liver.

## 2019-11-06 DIAGNOSIS — T1491XA Suicide attempt, initial encounter: Secondary | ICD-10-CM | POA: Insufficient documentation

## 2019-11-08 DIAGNOSIS — F1210 Cannabis abuse, uncomplicated: Secondary | ICD-10-CM | POA: Insufficient documentation

## 2019-11-08 DIAGNOSIS — F332 Major depressive disorder, recurrent severe without psychotic features: Secondary | ICD-10-CM | POA: Insufficient documentation

## 2019-11-08 DIAGNOSIS — F411 Generalized anxiety disorder: Secondary | ICD-10-CM | POA: Insufficient documentation

## 2019-11-10 DIAGNOSIS — T50902A Poisoning by unspecified drugs, medicaments and biological substances, intentional self-harm, initial encounter: Secondary | ICD-10-CM | POA: Insufficient documentation

## 2020-03-03 ENCOUNTER — Encounter (HOSPITAL_COMMUNITY): Payer: Self-pay

## 2020-03-03 ENCOUNTER — Emergency Department (HOSPITAL_COMMUNITY)
Admission: EM | Admit: 2020-03-03 | Discharge: 2020-03-03 | Disposition: A | Discharge status: Home / Self Care | Payer: Medicaid Other | Attending: Emergency Medicine | Admitting: Emergency Medicine

## 2020-03-03 ENCOUNTER — Other Ambulatory Visit: Payer: Self-pay

## 2020-03-03 DIAGNOSIS — Z7689 Persons encountering health services in other specified circumstances: Secondary | ICD-10-CM

## 2020-03-03 DIAGNOSIS — F1590 Other stimulant use, unspecified, uncomplicated: Secondary | ICD-10-CM | POA: Diagnosis not present

## 2020-03-03 DIAGNOSIS — Z113 Encounter for screening for infections with a predominantly sexual mode of transmission: Secondary | ICD-10-CM | POA: Diagnosis not present

## 2020-03-03 DIAGNOSIS — Z87891 Personal history of nicotine dependence: Secondary | ICD-10-CM | POA: Insufficient documentation

## 2020-03-03 LAB — URINALYSIS, ROUTINE W REFLEX MICROSCOPIC
Bilirubin Urine: NEGATIVE
Glucose, UA: NEGATIVE mg/dL
Hgb urine dipstick: NEGATIVE
Ketones, ur: 20 mg/dL — AB
Leukocytes,Ua: NEGATIVE
Nitrite: NEGATIVE
Protein, ur: NEGATIVE mg/dL
Specific Gravity, Urine: 1.023 (ref 1.005–1.030)
pH: 5 (ref 5.0–8.0)

## 2020-03-03 LAB — I-STAT BETA HCG BLOOD, ED (MC, WL, AP ONLY): I-stat hCG, quantitative: <5 m[IU]/mL (ref <5)

## 2020-03-03 MED ORDER — GENVOYA 150-150-200-10 MG PO TABS: 1.0000 | ORAL_TABLET | Freq: Every day | ORAL | 0 refills | Status: DC

## 2020-03-03 NOTE — Progress Notes (Signed)
   03/03/20 1755  TOC ED Mini Assessment  TOC Time spent with patient (minutes): 128  PING Used in TOC Assessment No  Admission or Readmission Diverted Yes  What brought you to the Emergency Department?  HIV exposure  [facility] ED CM received call   concerning assistance nPEP meds for HIV exposure.  CM called patient at [facility] to discuss the Npep process for uninsured patients, CM was provided verbal consent to proceed with application process at [facility] Advancing Access [phone].  CM started the application process and is awaiting approval for 30 day supply of Genvoya.

## 2020-03-03 NOTE — Discharge Instructions (Addendum)
Follow-up as planned at [facility] for further testing and prescription management.
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
## 2020-03-04 LAB — GC/CHLAMYDIA PROBE AMP (~~LOC~~) NOT AT ARMC
Chlamydia: NEGATIVE
Comment: NEGATIVE
Comment: NORMAL
Neisseria Gonorrhea: NEGATIVE

## 2020-03-04 LAB — RPR: RPR Ser Ql: NONREACTIVE

## 2020-03-04 LAB — HIV ANTIBODY (ROUTINE TESTING W REFLEX): HIV Screen 4th Generation wRfx: NONREACTIVE

## 2020-03-04 NOTE — Care Management (Signed)
[facility] ED CM received approval for [facility] voucher #[id number] BIN #[id number] Group #[id number] MOQ#[id number]  Updated EDP prescription being sent to [facility] on [address], CM will also provide information to [facility].  Patient verified that she can get to pharmacy to pick up medication.  No further ED CM needs identified.

## 2020-03-25 ENCOUNTER — Emergency Department (HOSPITAL_COMMUNITY)
Admission: EM | Admit: 2020-03-25 | Discharge: 2020-03-26 | Disposition: A | Discharge status: Home / Self Care | Payer: Medicaid Other | Attending: Emergency Medicine | Admitting: Emergency Medicine

## 2020-03-25 ENCOUNTER — Other Ambulatory Visit: Payer: Self-pay

## 2020-03-25 DIAGNOSIS — Z5321 Procedure and treatment not carried out due to patient leaving prior to being seen by health care provider: Secondary | ICD-10-CM | POA: Insufficient documentation

## 2020-03-25 DIAGNOSIS — Y999 Unspecified external cause status: Secondary | ICD-10-CM | POA: Insufficient documentation

## 2020-03-25 DIAGNOSIS — M25561 Pain in right knee: Secondary | ICD-10-CM | POA: Insufficient documentation

## 2020-03-25 DIAGNOSIS — R519 Headache, unspecified: Secondary | ICD-10-CM | POA: Diagnosis not present

## 2020-03-25 DIAGNOSIS — Y9389 Activity, other specified: Secondary | ICD-10-CM | POA: Insufficient documentation

## 2020-03-25 DIAGNOSIS — Y92410 Unspecified street and highway as the place of occurrence of the external cause: Secondary | ICD-10-CM | POA: Diagnosis not present
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
## 2020-03-25 NOTE — ED Notes (Signed)
[facility]. 

## 2020-06-01 ENCOUNTER — Inpatient Hospital Stay (HOSPITAL_COMMUNITY)
Admission: AD | Admit: 2020-06-01 | Discharge: 2020-06-01 | Disposition: A | Discharge status: Home / Self Care | Payer: Medicaid Other | Attending: Obstetrics and Gynecology | Admitting: Obstetrics and Gynecology

## 2020-06-01 ENCOUNTER — Encounter (HOSPITAL_COMMUNITY): Payer: Self-pay | Admitting: Obstetrics and Gynecology

## 2020-06-01 ENCOUNTER — Other Ambulatory Visit: Payer: Self-pay

## 2020-06-01 ENCOUNTER — Inpatient Hospital Stay (HOSPITAL_COMMUNITY): Payer: Medicaid Other

## 2020-06-01 DIAGNOSIS — Z87891 Personal history of nicotine dependence: Secondary | ICD-10-CM | POA: Insufficient documentation

## 2020-06-01 DIAGNOSIS — R1031 Right lower quadrant pain: Secondary | ICD-10-CM

## 2020-06-01 DIAGNOSIS — Z3A01 Less than 8 weeks gestation of pregnancy: Secondary | ICD-10-CM | POA: Diagnosis not present

## 2020-06-01 DIAGNOSIS — O26891 Other specified pregnancy related conditions, first trimester: Secondary | ICD-10-CM | POA: Diagnosis not present

## 2020-06-01 DIAGNOSIS — R109 Unspecified abdominal pain: Secondary | ICD-10-CM | POA: Diagnosis present

## 2020-06-01 DIAGNOSIS — Z3490 Encounter for supervision of normal pregnancy, unspecified, unspecified trimester: Secondary | ICD-10-CM

## 2020-06-01 LAB — WET PREP, GENITAL
Sperm: NONE SEEN
Trich, Wet Prep: NONE SEEN
Yeast Wet Prep HPF POC: NONE SEEN

## 2020-06-01 LAB — COMPREHENSIVE METABOLIC PANEL
ALT: 24 U/L (ref 0–44)
AST: 15 U/L (ref 15–41)
Albumin: 3.3 g/dL — ABNORMAL LOW (ref 3.5–5.0)
Alkaline Phosphatase: 41 U/L (ref 38–126)
Anion gap: 6 (ref 5–15)
BUN: <5 mg/dL — ABNORMAL LOW (ref 6–20)
CO2: 25 mmol/L (ref 22–32)
Calcium: 9.2 mg/dL (ref 8.9–10.3)
Chloride: 105 mmol/L (ref 98–111)
Creatinine, Ser: 0.74 mg/dL (ref 0.44–1.00)
GFR, Estimated: >60 mL/min (ref >60)
Glucose, Bld: 99 mg/dL (ref 70–99)
Potassium: 3.9 mmol/L (ref 3.5–5.1)
Sodium: 136 mmol/L (ref 135–145)
Total Bilirubin: 0.5 mg/dL (ref 0.3–1.2)
Total Protein: 6.7 g/dL (ref 6.5–8.1)

## 2020-06-01 LAB — CBC
HCT: 40.4 % (ref 36.0–46.0)
Hemoglobin: 14.2 g/dL (ref 12.0–15.0)
MCH: 31 pg (ref 26.0–34.0)
MCHC: 35.1 g/dL (ref 30.0–36.0)
MCV: 88.2 fL (ref 80.0–100.0)
Platelets: 356 10*3/uL (ref 150–400)
RBC: 4.58 MIL/uL (ref 3.87–5.11)
RDW: 12.3 % (ref 11.5–15.5)
WBC: 13.1 10*3/uL — ABNORMAL HIGH (ref 4.0–10.5)
nRBC: 0 % (ref 0.0–0.2)

## 2020-06-01 LAB — URINALYSIS, ROUTINE W REFLEX MICROSCOPIC
Bilirubin Urine: NEGATIVE
Glucose, UA: NEGATIVE mg/dL
Hgb urine dipstick: NEGATIVE
Ketones, ur: NEGATIVE mg/dL
Leukocytes,Ua: NEGATIVE
Nitrite: NEGATIVE
Protein, ur: NEGATIVE mg/dL
Specific Gravity, Urine: 1.01 (ref 1.005–1.030)
pH: 7 (ref 5.0–8.0)

## 2020-06-01 LAB — HCG, QUANTITATIVE, PREGNANCY: hCG, Beta Chain, Quant, S: 33103 m[IU]/mL — ABNORMAL HIGH (ref <5)

## 2020-06-01 NOTE — MAU Provider Note (Signed)
History     CSN: [id number]  Arrival date and time: 06/01/20 1113   Event Date/Time   First Provider Initiated Contact with Patient 06/01/20 1208      Chief Complaint  Patient presents with  . Cramps   Ms. Sandra Drake is a 20 y.o. G1P0 at [redacted]w[redacted]d who presents to MAU for abdominal cramping. [facility] and described intense RLQ pain that is not present at this time, and was sent to MAU for further evaluation.  Pt denies VB, LOF, ctx, decreased FM, vaginal discharge/odor/itching. Pt denies N/V, constipation, diarrhea, or urinary problems. Pt denies fever, chills, fatigue, sweating or changes in appetite. Pt denies SOB or chest pain. Pt denies dizziness, HA, light-headedness, weakness.   OB History    Gravida  1   Para      Term      Preterm      AB      Living        SAB      IAB      Ectopic      Multiple      Live Births              Past Medical History:  Diagnosis Date  . ADHD   . Anxiety   . Bronchitis   . Depression   . Heartburn   . IBS (irritable bowel syndrome)   . Seasonal allergies   . Substance abuse [facility])     Past Surgical History:  Procedure Laterality Date  . ESOPHAGOGASTRODUODENOSCOPY  2016   Normal  . GIVENS CAPSULE STUDY  01/2018   Capso Cam  . UPPER GASTROINTESTINAL ENDOSCOPY    . WISDOM TOOTH EXTRACTION      Family History  Problem Relation Age of Onset  . Esophageal cancer Neg Hx   . Rectal cancer Neg Hx   . Stomach cancer Neg Hx   . Colon cancer Neg Hx        pt unsure of family hx    Social History   Tobacco Use  . Smoking status: Former Smoker    Types: Cigarettes  . Smokeless tobacco: Never Used  . Tobacco comment: spokes a cigarette once a week  Vaping Use  . Vaping Use: Former  . Substances: Nicotine, Nicotine-salt  Substance Use Topics  . Alcohol use: Not Currently    Comment: occasional wine  . Drug use: Yes    Types: Marijuana    Comment:  Stopped February 2022    Allergies: No Known Allergies  Medications Prior to Admission  Medication Sig Dispense Refill Last Dose  . elvitegravir-cobicistat-emtricitabine-tenofovir (GENVOYA) 150-150-200-10 MG TABS tablet Take 1 tablet by mouth daily with breakfast. 30 tablet 0   . ondansetron (ZOFRAN ODT) 4 MG disintegrating tablet Place 1-2 tablets under tongue q 4 hours as needed for nausea 60 tablet 1     Review of Systems  Constitutional: Negative for chills, diaphoresis, fatigue and fever.  Eyes: Negative for visual disturbance.  Respiratory: Negative for shortness of breath.   Cardiovascular: Negative for chest pain.  Gastrointestinal: Positive for abdominal pain. Negative for constipation, diarrhea, nausea and vomiting.  Genitourinary: Negative for dysuria, flank pain, frequency, pelvic pain, urgency, vaginal bleeding and vaginal discharge.  Neurological: Negative for dizziness, weakness, light-headedness and headaches.   Physical Exam   Blood pressure 127/63, pulse 87, temperature 98.2 F (36.8 C), temperature source Oral, resp. rate 18, height 5\' 5"  (1.651 m), weight 116.2  kg, last menstrual period 04/14/2020, SpO2 98 %.  Patient Vitals for the past 24 hrs:  BP Temp Temp src Pulse Resp SpO2 Height Weight  06/01/20 1204 127/63 - - 87 18 - - -  06/01/20 1141 126/64 98.2 F (36.8 C) Oral 87 20 98 % - -  06/01/20 1133 - - - - - - 5\' 5"  (1.651 m) 116.2 kg   Physical Exam Vitals and nursing note reviewed.  Constitutional:      Appearance: Normal appearance.  HENT:     Head: Normocephalic and atraumatic.  Pulmonary:     Effort: Pulmonary effort is normal.  Neurological:     Mental Status: She is alert and oriented to person, place, and time.  Psychiatric:        Mood and Affect: Mood normal.        Behavior: Behavior normal.        Thought Content: Thought content normal.        Judgment: Judgment normal.    Results for orders placed or performed during the hospital  encounter of 06/01/20 (from the past 24 hour(s))  CBC     Status: Abnormal   Collection Time: 06/01/20 12:20 PM  Result Value Ref Range   WBC 13.1 (H) 4.0 - 10.5 K/uL   RBC 4.58 3.87 - 5.11 MIL/uL   Hemoglobin 14.2 12.0 - 15.0 g/dL   HCT 06/03/20 84.1 - 66.0 %   MCV 88.2 80.0 - 100.0 fL   MCH 31.0 26.0 - 34.0 pg   MCHC 35.1 30.0 - 36.0 g/dL   RDW 63.0 16.0 - 10.9 %   Platelets 356 150 - 400 K/uL   nRBC 0.0 0.0 - 0.2 %  Comprehensive metabolic panel     Status: Abnormal   Collection Time: 06/01/20 12:20 PM  Result Value Ref Range   Sodium 136 135 - 145 mmol/L   Potassium 3.9 3.5 - 5.1 mmol/L   Chloride 105 98 - 111 mmol/L   CO2 25 22 - 32 mmol/L   Glucose, Bld 99 70 - 99 mg/dL   BUN <5 (L) 6 - 20 mg/dL   Creatinine, Ser 06/03/20 0.44 - 1.00 mg/dL   Calcium 9.2 8.9 - 5.57 mg/dL   Total Protein 6.7 6.5 - 8.1 g/dL   Albumin 3.3 (L) 3.5 - 5.0 g/dL   AST 15 15 - 41 U/L   ALT 24 0 - 44 U/L   Alkaline Phosphatase 41 38 - 126 U/L   Total Bilirubin 0.5 0.3 - 1.2 mg/dL   GFR, Estimated 32.2 >02 mL/min   Anion gap 6 5 - 15  ABO/Rh     Status: None   Collection Time: 06/01/20 12:20 PM  Result Value Ref Range   ABO/RH(D)      A POS Performed at [facility], [address] [place], [place]   Wet prep, genital     Status: Abnormal   Collection Time: 06/01/20 12:22 PM   Specimen: PATH Cytology Cervicovaginal Ancillary Only  Result Value Ref Range   Yeast Wet Prep HPF POC NONE SEEN NONE SEEN   Trich, Wet Prep NONE SEEN NONE SEEN   Clue Cells Wet Prep HPF POC PRESENT (A) NONE SEEN   WBC, Wet Prep HPF POC MANY (A) NONE SEEN   Sperm NONE SEEN     06/03/20 OB LESS THAN 14 WEEKS WITH OB TRANSVAGINAL  Result Date: 06/01/2020 CLINICAL DATA:  Cramping EXAM: OBSTETRIC <14 WK 06/03/2020 AND TRANSVAGINAL OB  US TECHNIQUE: Both transabdominal and transvaginal ultrasound examinations were performed for complete evaluation of the gestation as well as the maternal uterus, adnexal regions, and pelvic  cul-de-sac. Transvaginal technique was performed to assess early pregnancy. COMPARISON:  None. FINDINGS: Intrauterine gestational sac: Single Yolk sac:  Visualized. Embryo:  Visualized. Cardiac Activity: Visualized. Heart Rate: 124 bpm CRL:  7.3 mm   6 w   4 d                  [place] EDC: 01/21/2021 Subchorionic hemorrhage:  None visualized. Maternal uterus/adnexae: Unremarkable.  No free fluid. IMPRESSION: Single live intrauterine pregnancy as described. Electronically Signed   By: Guadlupe Spanish M.D.   On: 06/01/2020 13:33    MAU Course  Procedures  MDM -r/o ectopic -UA: pending at time of discharge -CBC: WNL for pregnancy -CMP: no abnormalities requiring treatment -[place]: single GS, +yolk sac, +embryo, +FHR 124 -hCG: pending at time of discharge -ABO: A Positive -WetPrep: +ClueCells (isolated finding not requiring treatment) -GC/CT collected -pt with Genvoya on her medication list, but states this was given for PEP and she had a negative HIV test when she started treatment and at completion of treatment -pt discharged to home in stable condition  Orders Placed This Encounter  Procedures  . Wet prep, genital    Standing Status:   Standing    Number of Occurrences:   1  . US OB LESS THAN 14 WEEKS WITH OB TRANSVAGINAL    Standing Status:   Standing    Number of Occurrences:   1    Order Specific Question:   Symptom/Reason for Exam    Answer:   Abdominal cramping [288105]  . Urinalysis, Routine w reflex microscopic Urine, Clean Catch    Standing Status:   Standing    Number of Occurrences:   1  . CBC    Standing Status:   Standing    Number of Occurrences:   1  . Comprehensive metabolic panel    Standing Status:   Standing    Number of Occurrences:   1  . hCG, quantitative, pregnancy    Standing Status:   Standing    Number of Occurrences:   1  . ABO/Rh    Standing Status:   Standing    Number of Occurrences:   1  . Discharge patient    Order Specific Question:   Discharge  disposition    Answer:   01-Home or Self Care [1]    Order Specific Question:   Discharge patient date    Answer:   06/01/2020   No orders of the defined types were placed in this encounter.  Assessment and Plan   1. Intrauterine pregnancy   2. Abdominal cramping   3. [redacted] weeks gestation of pregnancy    Allergies as of 06/01/2020   No Known Allergies     Medication List    TAKE these medications   Genvoya 150-150-200-10 MG Tabs tablet Generic drug: elvitegravir-cobicistat-emtricitabine-tenofovir Take 1 tablet by mouth daily with breakfast.   ondansetron 4 MG disintegrating tablet Commonly known as: Zofran ODT Place 1-2 tablets under tongue q 4 hours as needed for nausea      -will call with culture results, if positive -OB providers list given -safe medications in pregnancy list given -return MAU precautions given -pt discharged to home in stable condition  Joni Reining E Nugent 06/01/2020, 1:59 PM

## 2020-06-01 NOTE — Progress Notes (Signed)
Pt self swabbed to obtain GC/Chlamydia and wet prep cultures.

## 2020-06-01 NOTE — Discharge Instructions (Signed)
Prenatal Care Providers           [facility] for [facility] - accepts patients without insurance  Phone: [phone]  [facility] for [facility] @ [facility]   Phone: [phone]  [facility]       Phone: [phone]            [facility] for [facility] @ [place]     Phone: [phone]          [facility] for [facility] @ [place]   Phone: [phone]  [facility] for [facility] @ [facility] - accepts patients without insurance  Phone: [phone]  [facility] for [facility] @ [facility] Phone: [phone]     [facility] Department - accepts patients without insurance Phone: [phone]  [facility]  Phone: [phone]  [phone] OB/GYN Phone: [phone]  [facility] Phone: [phone]  [facility] Phone: [phone]  [facility] Phone: [phone]  [facility]  Phone: [phone]                         Safe Medications in Pregnancy    Acne: Benzoyl Peroxide Salicylic Acid  Backache/Headache: Tylenol: 2 regular strength every 4 hours OR              2 Extra strength every 6 hours  Colds/Coughs/Allergies: Benadryl (alcohol free) 25 mg every 6 hours as needed Breath right strips Claritin Cepacol throat lozenges Chloraseptic throat spray Cold-Eeze- up to three times per day Cough drops, alcohol free Flonase (by prescription only) Guaifenesin Mucinex Robitussin DM (plain only, alcohol free) Saline nasal spray/drops Sudafed (pseudoephedrine) & Actifed ** use only after [redacted] weeks gestation and if you do not have high blood pressure Tylenol Vicks Vaporub Zinc lozenges Zyrtec   Constipation: Colace Ducolax suppositories Fleet enema Glycerin suppositories Metamucil Milk of magnesia Miralax Senokot Smooth move tea  Diarrhea: Kaopectate Imodium A-D  *NO pepto  Bismol  Hemorrhoids: Anusol Anusol HC Preparation H Tucks  Indigestion: Tums Maalox Mylanta Zantac  Pepcid  Insomnia: Benadryl (alcohol free) 25mg  every 6 hours as needed Tylenol PM Unisom, no Gelcaps  Leg Cramps: Tums MagGel  Nausea/Vomiting:  Bonine Dramamine Emetrol Ginger extract Sea bands Meclizine  Nausea medication to take during pregnancy:  Unisom (doxylamine succinate 25 mg tablets) Take one tablet daily at bedtime. If symptoms are not adequately controlled, the dose can be increased to a maximum recommended dose of two tablets daily (1/2 tablet in the morning, 1/2 tablet mid-afternoon and one at bedtime). Vitamin B6 100mg  tablets. Take one tablet twice a day (up to 200 mg per day).  Skin Rashes: Aveeno products Benadryl cream or 25mg  every 6 hours as needed Calamine Lotion 1% cortisone cream  Yeast infection: Gyne-lotrimin 7 Monistat 7   **If taking multiple medications, please check labels to avoid duplicating the same active ingredients **take medication as directed on the label ** Do not exceed 4000 mg of tylenol in 24 hours **Do not take medications that contain aspirin or ibuprofen           Obstetrics: Normal and Problem Pregnancies (7th ed., pp. 102-121). [place], [place]: Elsevier."> Textbook of Family Medicine (9th ed., pp. [phone]). [place], [place]: Elsevier [employer].">  First Trimester of Pregnancy  The first trimester of pregnancy starts on the first day of your last menstrual period until the end of week 12. This is months 1 through 3 of pregnancy. A  week after a sperm fertilizes an egg, the egg will implant into the wall of the uterus and begin to develop into a baby. By the end of 12 weeks, all the baby's organs will be formed and the baby will be 2-3 inches in size. Body changes during your first trimester Your body goes through many changes during pregnancy. The changes vary and generally return to normal after your baby is  born. Physical changes  You may gain or lose weight.  Your breasts may begin to grow larger and become tender. The tissue that surrounds your nipples (areola) may become darker.  Dark spots or blotches (chloasma or mask of pregnancy) may develop on your face.  You may have changes in your hair. These can include thickening or thinning of your hair or changes in texture. Health changes  You may feel nauseous, and you may vomit.  You may have heartburn.  You may develop headaches.  You may develop constipation.  Your gums may bleed and may be sensitive to brushing and flossing. Other changes  You may tire easily.  You may urinate more often.  Your menstrual periods will stop.  You may have a loss of appetite.  You may develop cravings for certain kinds of food.  You may have changes in your emotions from day to day.  You may have more vivid and strange dreams. Follow these instructions at home: Medicines  Follow your health care provider's instructions regarding medicine use. Specific medicines may be either safe or unsafe to take during pregnancy. Do not take any medicines unless told to by your health care provider.  Take a prenatal vitamin that contains at least 600 micrograms (mcg) of folic acid. Eating and drinking  Eat a healthy diet that includes fresh fruits and vegetables, whole grains, good sources of protein such as meat, eggs, or tofu, and low-fat dairy products.  Avoid raw meat and unpasteurized juice, milk, and cheese. These carry germs that can harm you and your baby.  If you feel nauseous or you vomit: ? Eat 4 or 5 small meals a day instead of 3 large meals. ? Try eating a few soda crackers. ? Drink liquids between meals instead of during meals.  You may need to take these actions to prevent or treat constipation: ? Drink enough fluid to keep your urine pale yellow. ? Eat foods that are high in fiber, such as beans, whole grains, and fresh fruits  and vegetables. ? Limit foods that are high in fat and processed sugars, such as fried or sweet foods. Activity  Exercise only as directed by your health care provider. Most people can continue their usual exercise routine during pregnancy. Try to exercise for 30 minutes at least 5 days a week.  Stop exercising if you develop pain or cramping in the lower abdomen or lower back.  Avoid exercising if it is very hot or humid or if you are at high altitude.  Avoid heavy lifting.  If you choose to, you may have sex unless your health care provider tells you not to. Relieving pain and discomfort  Wear a good support bra to relieve breast tenderness.  Rest with your legs elevated if you have leg cramps or low back pain.  If you develop bulging veins (varicose veins) in your legs: ? Wear support hose as told by your health care provider. ? Elevate your feet for 15 minutes, 3-4 times a day. ? Limit salt in your diet. Safety  Wear your  seat belt at all times when driving or riding in a car.  Talk with your health care provider if someone is verbally or physically abusive to you.  Talk with your health care provider if you are feeling sad or have thoughts of hurting yourself. Lifestyle  Do not use hot tubs, steam rooms, or saunas.  Do not douche. Do not use tampons or scented sanitary pads.  Do not use herbal remedies, alcohol, illegal drugs, or medicines that are not approved by your health care provider. Chemicals in these products can harm your baby.  Do not use any products that contain nicotine or tobacco, such as cigarettes, e-cigarettes, and chewing tobacco. If you need help quitting, ask your health care provider.  Avoid cat litter boxes and soil used by cats. These carry germs that can cause birth defects in the baby and possibly loss of the unborn baby (fetus) by miscarriage or stillbirth. General instructions  During routine prenatal visits in the first trimester, your  health care provider will do a physical exam, perform necessary tests, and ask you how things are going. Keep all follow-up visits. This is important.  Ask for help if you have counseling or nutritional needs during pregnancy. Your health care provider can offer advice or refer you to specialists for help with various needs.  Schedule a dentist appointment. At home, brush your teeth with a soft toothbrush. Floss gently.  Write down your questions. Take them to your prenatal visits. Where to find more information  American Pregnancy Association: americanpregnancy.org  [employer] of [employer]st[employer]ians and Gynecologists: https://www.todd-brady.net/  Office on [employer] Health: MightyReward.[employer].nz Contact a health care provider if you have:  Dizziness.  A fever.  Mild pelvic cramps, pelvic pressure, or nagging pain in the abdominal area.  Nausea, vomiting, or diarrhea that lasts for 24 hours or longer.  A bad-smelling vaginal discharge.  Pain when you urinate.  Known exposure to a contagious illness, such as chickenpox, measles, Zika virus, HIV, or hepatitis. Get help right away if you have:  Spotting or bleeding from your vagina.  Severe abdominal cramping or pain.  Shortness of breath or chest pain.  Any kind of trauma, such as from a fall or a car crash.  New or increased pain, swelling, or redness in an arm or leg. Summary  The first trimester of pregnancy starts on the first day of your last menstrual period until the end of week 12 (months 1 through 3).  Eating 4 or 5 small meals a day rather than 3 large meals may help to relieve nausea and vomiting.  Do not use any products that contain nicotine or tobacco, such as cigarettes, e-cigarettes, and chewing tobacco. If you need help quitting, ask your health care provider.  Keep all follow-up visits. This is important. This information is not intended to replace advice given to you by your health care  provider. Make sure you discuss any questions you have with your health care provider. Document Revised: 09/09/2019 Document Reviewed: 07/16/2019 Elsevier Patient Education  2021 Elsevier Inc.         Abdominal Pain During Pregnancy Abdominal pain is common during pregnancy and has many possible causes. Some causes are more serious than others, and sometimes the cause is not known. Abdominal pain can be a sign that labor is starting. It can also be caused by normal growth of your baby causing stretching of muscles and ligaments during pregnancy. Always tell your health care provider if you have any abdominal pain.  Follow these instructions at home:  Do not have sex or put anything in your vagina until your pain goes away completely.  Get plenty of rest until your pain improves.  Drink enough fluid to keep your urine pale yellow.  Take over-the-counter and prescription medicines only as told by your health care provider.  Keep all follow-up visits. This is important.   Contact a health care provider if:  Your pain continues or gets worse after resting.  You have lower abdominal pain that: ? Comes and goes at regular intervals. ? Spreads to your back. ? Is similar to menstrual cramps.  You have pain or burning when you urinate. Get help right away if:  You have a fever, chills, or shortness of breath.  You have vaginal bleeding.  You are leaking fluid or passing tissue from your vagina.  You have vomiting or diarrhea that lasts for more than 24 hours.  Your baby is moving less than usual.  You feel very weak or faint.  You develop severe pain in your upper abdomen. Summary  Abdominal pain is common during pregnancy and has many possible causes.  If you experience abdominal pain during pregnancy, tell your health care provider right away.  Follow your health care provider's home care instructions and keep all follow-up visits as told. This information is not  intended to replace advice given to you by your health care provider. Make sure you discuss any questions you have with your health care provider. Document Revised: 12/15/2019 Document Reviewed: 12/15/2019 Elsevier Patient Education  2021 [employer]

## 2020-06-01 NOTE — MAU Note (Addendum)
[facility] to get "checked out" secondary abdominal cramping. States not cramping today, but has had cramping previously.  Reports had bad cramping 3 days ago.  Denies VB.  LMP approx 12/27/202.  +UPT @ [facility] and has verification letter.

## 2020-06-02 LAB — ABO/RH: ABO/RH(D): A POS

## 2020-06-02 LAB — GC/CHLAMYDIA PROBE AMP (~~LOC~~) NOT AT ARMC
Chlamydia: NEGATIVE
Comment: NEGATIVE
Comment: NORMAL
Neisseria Gonorrhea: NEGATIVE

## 2021-02-27 ENCOUNTER — Encounter: Payer: Self-pay | Admitting: Family

## 2021-02-27 ENCOUNTER — Telehealth: Payer: Medicaid Other | Admitting: Family

## 2021-02-27 DIAGNOSIS — F17200 Nicotine dependence, unspecified, uncomplicated: Secondary | ICD-10-CM | POA: Diagnosis not present

## 2021-02-27 DIAGNOSIS — Z8759 Personal history of other complications of pregnancy, childbirth and the puerperium: Secondary | ICD-10-CM

## 2021-02-27 DIAGNOSIS — R350 Frequency of micturition: Secondary | ICD-10-CM

## 2021-02-27 NOTE — Patient Instructions (Signed)
If you do not have a PCP, [place] offers a free physician referral service available at [phone]. Our trained staff has the experience, knowledge and resources to put you in touch with a physician who is right for you.     Urinary Frequency, Adult Urinary frequency means urinating more often than usual. You may urinate every 1-2 hours even though you drink a normal amount of fluid and do not have a bladder infection or condition. Although you urinate more often than normal, the total amount of urine produced in a day is normal. With urinary frequency, you may have an urgent need to urinate often. The stress and anxiety of needing to find a bathroom quickly can make this urge worse. This condition may go away on its own, or you may need treatment at home. Home treatment may include bladder training, exercises, taking medicines, or making changes to your diet. Follow these instructions at home: Bladder health Your health care provider will tell you what to do to improve bladder health. You may be told to: Keep a bladder diary. Keep track of: What you eat and drink. How often you urinate. How much you urinate. Follow a bladder training program. This may include: Learning to delay going to the bathroom. Double urinating, also called voiding. This helps if you are not completely emptying your bladder. Scheduled voiding. Do Kegel exercises. Kegel exercises strengthen the muscles that help control urination, which may help the condition.  Eating and drinking Follow instructions from your health care provider about eating or drinking restrictions. You may be told to: Avoid caffeine. Drink fewer fluids, especially alcohol. Avoid drinking in the evening. Avoid foods or drinks that may irritate the bladder. These include coffee, tea, soda, artificial sweeteners, citrus, tomato-based foods, and chocolate. Eat foods that help prevent or treat constipation. Constipation can make urinary  frequency worse. You may need to take these actions to prevent or treat constipation: Drink enough fluid to keep your urine pale yellow. Take over-the-counter or prescription medicines. Eat foods that are high in fiber, such as beans, whole grains, and fresh fruits and vegetables. Limit foods that are high in fat and processed sugars, such as fried or sweet foods. General instructions Take over-the-counter and prescription medicines only as told by your health care provider. Keep all follow-up visits. This is important. Contact a health care provider if: You start urinating more often. You feel pain or irritation when you urinate. You notice blood in your urine. Your urine looks cloudy. You develop a fever. You begin vomiting. Get help right away if: You are unable to urinate. Summary Urinary frequency means urinating more often than usual. With urinary frequency, you may urinate every 1-2 hours even though you drink a normal amount of fluid and do not have a bladder infection or other bladder condition. Your health care provider may recommend that you keep a bladder diary, follow a bladder training program, or make dietary changes. If told by your health care provider, do Kegel exercises to strengthen the muscles that help control urination. Take over-the-counter and prescription medicines only as told by your health care provider. Contact a health care provider if your symptoms do not improve or get worse. This information is not intended to replace advice given to you by your health care provider. Make sure you discuss any questions you have with your health care provider. Document Revised: 11/06/2019 Document Reviewed: 11/06/2019 Elsevier Patient Education  2022 [employer].

## 2021-02-27 NOTE — Progress Notes (Signed)
Virtual Visit Consent   [facility], you are scheduled for a virtual visit with a [facility] provider today.     Just as with appointments in the office, your consent must be obtained to participate.  Your consent will be active for this visit and any virtual visit you may have with one of our providers in the next 365 days.     If you have a MyChart account, a copy of this consent can be sent to you electronically.  All virtual visits are billed to your insurance company just like a traditional visit in the office.    As this is a virtual visit, video technology does not allow for your provider to perform a traditional examination.  This may limit your provider's ability to fully assess your condition.  If your provider identifies any concerns that need to be evaluated in person or the need to arrange testing (such as labs, EKG, etc.), we will make arrangements to do so.     Although advances in technology are sophisticated, we cannot ensure that it will always work on either your end or our end.  If the connection with a video visit is poor, the visit may have to be switched to a telephone visit.  With either a video or telephone visit, we are not always able to ensure that we have a secure connection.     I need to obtain your verbal consent now.   Are you willing to proceed with your visit today?    [place] has provided verbal consent on 02/27/2021 for a virtual visit (video or telephone).   Jannifer Rodney, FNP   Date: 02/27/2021 4:42 PM   Virtual Visit via Video Note   I, Jannifer Rodney, connected with  [facility]  ([id number], 2000/12/27) on 02/27/21 at  5:00 PM EST by a video-enabled telemedicine application and verified that I am speaking with the correct person using two identifiers.  Location: Patient: Virtual Visit Location Patient: Home Provider: Virtual Visit Location Provider: Office/Clinic   I discussed the limitations of evaluation  and management by telemedicine and the availability of in person appointments. The patient expressed understanding and agreed to proceed.    History of Present Illness: Sandra Drake is a 20 y.o. who identifies as a female who was assigned female at birth, and is being seen today for urinary frequency since January after she found out she was pregnant. However, she had an abortion in March, but her urinary frequency has continued. Denies any dysuria, hematuria,fevers, flank pain, increased hunger or thirst.   Current smoker and vaper.   HPI: Urinary Frequency  The current episode started more than 1 year ago. The problem has been waxing and waning. The patient is experiencing no pain. Associated symptoms include frequency. Pertinent negatives include no hematuria, hesitancy, nausea, urgency or vomiting. She has tried nothing for the symptoms. The treatment provided no relief.   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  Resting comfortably  at  home.  Head is normocephalic, atraumatic.  No labored breathing.  Speech is clear and coherent with logical content.  Patient is alert and oriented at baseline.    Assessment and Plan: 1. Urinary frequency  2. H/O abortion Recommend follow up with PCP for urine testing and lab work. If negative may need to see Urologists.  Avoid caffeine Smoking cessation  Follow Up Instructions: I  discussed the assessment and treatment plan with the patient. The patient was provided an opportunity to ask questions and all were answered. The patient agreed with the plan and demonstrated an understanding of the instructions.  A copy of instructions were sent to the patient via MyChart unless otherwise noted below.     The patient was advised to call back or seek an in-person evaluation if the symptoms worsen or if the condition fails to improve as anticipated.  Time:  I spent 15 minutes with the patient via telehealth technology discussing the above problems/concerns.    Evelina Dun, FNP

## 2021-04-13 ENCOUNTER — Ambulatory Visit: Payer: Medicaid Other | Admitting: Family Medicine

## 2021-04-16 ENCOUNTER — Other Ambulatory Visit: Payer: Self-pay

## 2021-04-16 ENCOUNTER — Encounter (HOSPITAL_COMMUNITY): Payer: Self-pay | Admitting: Emergency Medicine

## 2021-04-16 ENCOUNTER — Emergency Department (HOSPITAL_COMMUNITY)
Admission: EM | Admit: 2021-04-16 | Discharge: 2021-04-16 | Disposition: A | Discharge status: Home / Self Care | Payer: Medicaid Other | Attending: Emergency Medicine | Admitting: Emergency Medicine

## 2021-04-16 ENCOUNTER — Emergency Department (HOSPITAL_COMMUNITY): Payer: Medicaid Other

## 2021-04-16 DIAGNOSIS — S0990XA Unspecified injury of head, initial encounter: Secondary | ICD-10-CM | POA: Diagnosis present

## 2021-04-16 DIAGNOSIS — S060X0A Concussion without loss of consciousness, initial encounter: Secondary | ICD-10-CM | POA: Insufficient documentation

## 2021-04-16 DIAGNOSIS — U071 COVID-19: Secondary | ICD-10-CM | POA: Insufficient documentation

## 2021-04-16 LAB — RESP PANEL BY RT-PCR (FLU A&B, COVID) ARPGX2
Influenza A by PCR: NEGATIVE
Influenza B by PCR: NEGATIVE
SARS Coronavirus 2 by RT PCR: POSITIVE — AB

## 2021-04-16 MED ORDER — ONDANSETRON 4 MG PO TBDP: 4.0000 mg | ORAL_TABLET | Freq: Three times a day (TID) | ORAL | 0 refills | Status: DC | PRN
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
## 2021-04-16 NOTE — Discharge Instructions (Addendum)
CT scan of your head and neck were normal.  You likely have a concussion.  I have attached information sheet regarding concussion but continue to take Tylenol and Motrin for your headache.  I have also given you Zofran for nausea.  If you develop significant nausea vomiting to the point you are unable to keep any food or drink down despite taking Zofran please return to the emergency room.  If you develop any new or worsening symptoms such as weakness please return to the emergency room for evaluation.  You are also positive for COVID today.  As discussed continue taking Tylenol and Motrin for fever control as you need to.  Increase your hydration.  Take Zofran as needed for nausea.
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
## 2021-04-25 ENCOUNTER — Telehealth: Payer: Medicaid Other | Admitting: Nurse Practitioner

## 2021-04-25 ENCOUNTER — Ambulatory Visit: Payer: Self-pay

## 2021-04-25 DIAGNOSIS — L089 Local infection of the skin and subcutaneous tissue, unspecified: Secondary | ICD-10-CM

## 2021-04-25 DIAGNOSIS — L729 Follicular cyst of the skin and subcutaneous tissue, unspecified: Secondary | ICD-10-CM | POA: Diagnosis not present

## 2021-04-25 MED ORDER — SULFAMETHOXAZOLE-TRIMETHOPRIM 800-160 MG PO TABS: 1.0000 | ORAL_TABLET | Freq: Two times a day (BID) | ORAL | 0 refills | Status: DC
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
## 2021-04-25 NOTE — Patient Instructions (Signed)
Cellulitis, Adult Cellulitis is a skin infection. The infected area is often warm, red, swollen, and sore. It occurs most often in the arms and lower legs. It is very important to get treated for this condition. What are the causes? This condition is caused by bacteria. The bacteria enter through a break in the skin, such as a cut, burn, insect bite, open sore, or crack. What increases the risk? This condition is more likely to occur in people who: Have a weak body defense system (immune system). Have open cuts, burns, bites, or scrapes on the skin. Are older than 23 years of age. Have a blood sugar problem (diabetes). Have a long-lasting (chronic) liver disease (cirrhosis) or kidney disease. Are very overweight (obese). Have a skin problem, such as: Itchy rash (eczema). Slow movement of blood in the veins (venous stasis). Fluid buildup below the skin (edema). Have been treated with high-energy rays (radiation). Use IV drugs. What are the signs or symptoms? Symptoms of this condition include: Skin that is: Red. Streaking. Spotting. Swollen. Sore or painful when you touch it. Warm. A fever. Chills. Blisters. How is this diagnosed? This condition is diagnosed based on: Medical history. Physical exam. Blood tests. Imaging tests. How is this treated? Treatment for this condition may include: Medicines to treat infections or allergies. Home care, such as: Rest. Placing cold or warm cloths (compresses) on the skin. Hospital care, if the condition is very bad. Follow these instructions at home: Medicines Take over-the-counter and prescription medicines only as told by your doctor. If you were prescribed an antibiotic medicine, take it as told by your doctor. Do not stop taking it even if you start to feel better. General instructions  Drink enough fluid to keep your pee (urine) pale yellow. Do not touch or rub the infected area. Raise (elevate) the infected area above the  level of your heart while you are sitting or lying down. Place cold or warm cloths on the area as told by your doctor. Keep all follow-up visits as told by your doctor. This is important. Contact a doctor if: You have a fever. You do not start to get better after 1-2 days of treatment. Your bone or joint under the infected area starts to hurt after the skin has healed. Your infection comes back. This can happen in the same area or another area. You have a swollen bump in the area. You have new symptoms. You feel ill and have muscle aches and pains. Get help right away if: Your symptoms get worse. You feel very sleepy. You throw up (vomit) or have watery poop (diarrhea) for a long time. You see red streaks coming from the area. Your red area gets larger. Your red area turns dark in color. These symptoms may represent a serious problem that is an emergency. Do not wait to see if the symptoms will go away. Get medical help right away. Call your local emergency services (911 in the [place] Do not drive yourself to the hospital. Summary Cellulitis is a skin infection. The area is often warm, red, swollen, and sore. This condition is treated with medicines, rest, and cold and warm cloths. Take all medicines only as told by your doctor. Tell your doctor if symptoms do not start to get better after 1-2 days of treatment. This information is not intended to replace advice given to you by your health care provider. Make sure you discuss any questions you have with your health care provider. Document Revised: 08/22/2017 Document Reviewed:   08/22/2017 Elsevier Patient Education  2022 Elsevier Inc.  

## 2021-04-25 NOTE — Progress Notes (Signed)
Virtual Visit Consent   [facility], you are scheduled for a virtual visit with Mary-Margaret Daphine Deutscher, FNP, a [facility] provider, today.     Just as with appointments in the office, your consent must be obtained to participate.  Your consent will be active for this visit and any virtual visit you may have with one of our providers in the next 365 days.     If you have a MyChart account, a copy of this consent can be sent to you electronically.  All virtual visits are billed to your insurance company just like a traditional visit in the office.    As this is a virtual visit, video technology does not allow for your provider to perform a traditional examination.  This may limit your provider's ability to fully assess your condition.  If your provider identifies any concerns that need to be evaluated in person or the need to arrange testing (such as labs, EKG, etc.), we will make arrangements to do so.     Although advances in technology are sophisticated, we cannot ensure that it will always work on either your end or our end.  If the connection with a video visit is poor, the visit may have to be switched to a telephone visit.  With either a video or telephone visit, we are not always able to ensure that we have a secure connection.     I need to obtain your verbal consent now.   Are you willing to proceed with your visit today? YES   Sandra Drake has provided verbal consent on 04/25/2021 for a virtual visit (video or telephone).   Mary-Margaret Daphine Deutscher, FNP   Date: 04/25/2021 4:01 PM   Virtual Visit via Video Note   I, Mary-Margaret Daphine Deutscher, connected with [facility] ([id number], 11/10/2000) on 04/25/21 at  4:15 PM EST by a video-enabled telemedicine application and verified that I am speaking with the correct person using two identifiers.  Location: Patient: Virtual Visit Location Patient: Home Provider: Virtual Visit Location Provider: Mobile   I  discussed the limitations of evaluation and management by telemedicine and the availability of in person appointments. The patient expressed understanding and agreed to proceed.    History of Present Illness: Sandra Drake is a 21 y.o. who identifies as a female who was assigned female at birth, and is being seen today for cyst undr breast.  HPI: Patient states she has a cyst under her left breast. She noticed it a week ago. It is now sore and red. Busted this morning and has pus coming out. Has foul smell.   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  Resting comfortably  at home.  Head is normocephalic, atraumatic.  No labored breathing.  Speech is clear and coherent with logical content.  Patient is alert and oriented at baseline.  2 cm erythematous lesion draining yellowish excudate  Assessment and Plan:  [facility] in today with chief  complaint of Cyst (/)   1. Infected cyst of skin Under left breast Keep clean and dry Warm compresses  Meds ordered this encounter  Medications   sulfamethoxazole-trimethoprim (BACTRIM DS) 800-160 MG tablet    Sig: Take 1 tablet by mouth 2 (two) times daily.    Dispense:  20 tablet    Refill:  0    Order Specific Question:   Supervising Provider    Answer:   Eber Hong [[id number]]        Follow Up Instructions: I discussed the  assessment and treatment plan with the patient. The patient was provided an opportunity to ask questions and all were answered. The patient agreed with the plan and demonstrated an understanding of the instructions.  A copy of instructions were sent to the patient via MyChart.  The patient was advised to call back or seek an in-person evaluation if the symptoms worsen or if the condition fails to improve as anticipated.  Time:  I spent 10 minutes with the patient via telehealth technology discussing the above problems/concerns.    Mary-Margaret Daphine Deutscher, FNP

## 2021-06-06 ENCOUNTER — Telehealth: Payer: Medicaid Other | Admitting: Physician Assistant

## 2021-06-06 ENCOUNTER — Emergency Department (HOSPITAL_COMMUNITY)
Admission: EM | Admit: 2021-06-06 | Discharge: 2021-06-06 | Disposition: A | Discharge status: Home / Self Care | Payer: Medicaid Other | Attending: Emergency Medicine | Admitting: Emergency Medicine

## 2021-06-06 ENCOUNTER — Encounter (HOSPITAL_COMMUNITY): Payer: Self-pay | Admitting: Emergency Medicine

## 2021-06-06 ENCOUNTER — Other Ambulatory Visit: Payer: Self-pay

## 2021-06-06 DIAGNOSIS — K0889 Other specified disorders of teeth and supporting structures: Secondary | ICD-10-CM | POA: Insufficient documentation

## 2021-06-06 MED ORDER — MELOXICAM 15 MG PO TABS: 15.0000 mg | ORAL_TABLET | Freq: Every day | ORAL | 0 refills | Status: AC

## 2021-06-06 MED ORDER — AMOXICILLIN-POT CLAVULANATE 875-125 MG PO TABS: 1.0000 | ORAL_TABLET | Freq: Two times a day (BID) | ORAL | 0 refills | Status: DC

## 2021-06-06 MED ORDER — HYDROCODONE-ACETAMINOPHEN 5-325 MG PO TABS
1.0000 | ORAL_TABLET | Freq: Once | ORAL | Status: AC
  Administered 2021-06-06: 1 via ORAL
  Filled 2021-06-06: qty 1

## 2021-06-06 NOTE — Patient Instructions (Signed)
°  Sandra Drake, thank you for joining Piedad Climes, PA-C for today's virtual visit.  While this provider is not your primary care provider (PCP), if your PCP is located in our provider database this encounter information will be shared with them immediately following your visit.  Consent: (Patient) Sandra Drake provided verbal consent for this virtual visit at the beginning of the encounter.  Current Medications:  Current Outpatient Medications:    amoxicillin-clavulanate (AUGMENTIN) 875-125 MG tablet, Take 1 tablet by mouth 2 (two) times daily., Disp: 14 tablet, Rfl: 0   meloxicam (MOBIC) 15 MG tablet, Take 1 tablet (15 mg total) by mouth daily., Disp: 30 tablet, Rfl: 0   etonogestrel (NEXPLANON) 68 MG IMPL implant, 68 mg., Disp: , Rfl:    Medications ordered in this encounter:  Meds ordered this encounter  Medications   amoxicillin-clavulanate (AUGMENTIN) 875-125 MG tablet    Sig: Take 1 tablet by mouth 2 (two) times daily.    Dispense:  14 tablet    Refill:  0    Order Specific Question:   Supervising Provider    Answer:   Hyacinth Meeker, BRIAN [[id number]]   meloxicam (MOBIC) 15 MG tablet    Sig: Take 1 tablet (15 mg total) by mouth daily.    Dispense:  30 tablet    Refill:  0    Order Specific Question:   Supervising Provider    Answer:   Hyacinth Meeker, BRIAN [[id number]]     *If you need refills on other medications prior to your next appointment, please contact your pharmacy*  Follow-Up: Call back or seek an in-person evaluation if the symptoms worsen or if the condition fails to improve as anticipated.  Other Instructions Please take the Meloxicam once daily with food. Tylenol ES for breakthrough pain. You can apply topical Orajel to the area.  I recommend using OTC Peroxyl mouthwash 1-2 x daily to help clean and promote healing.  Take the antibiotic as directed with food. Follow-up with your dental provider as discussed.    If you have been instructed to have an  in-person evaluation today at a local Urgent Care facility, please use the link below. It will take you to a list of all of our available [facility], including address, phone number and hours of operation. Please do not delay care.  [facility]  If you or a family member do not have a primary care provider, use the link below to schedule a visit and establish care. When you choose a [facility] primary care physician or advanced practice provider, you gain a long-term partner in health. Find a Primary Care Provider  Learn more about [facility]'s in-office and virtual care options: [facility] - Get Care Now

## 2021-06-06 NOTE — Discharge Instructions (Addendum)
Please continue to take your prescribed Mobic as well as Augmentin.  Below is the contact information for Dr. Haig Prophet who is a Database administrator.  Please give him a call at your convenience to schedule an appointment for reevaluation.  If you develop any new or worsening symptoms please come back to the emergency department.

## 2021-06-06 NOTE — ED Triage Notes (Signed)
Patient reports intermittent left lower molar pain for several months unrelieved by OTC pain medications .

## 2021-06-06 NOTE — ED Provider Notes (Signed)
[facility] EMERGENCY DEPARTMENT Provider Note   CSN: [id number] Arrival date & time: 06/06/21  2227     History  Chief Complaint  Patient presents with   Dental Pain    Sandra Drake is a 21 y.o. female.  HPI Patient is a 21 year old female who presents to the emergency department due to dental pain.  Patient states that she has been experiencing intermittent left lower dental pain for the past year and earlier today was at a local dentist to have it extracted but could not tolerate the procedure and went home prior to the tooth being extracted.  Reports continued pain in the region.  Denies any fevers, drooling, difficulty swallowing.  She states that she had a video visit later today due to the pain and was prescribed Augmentin as well as Mobic which she began taking with little relief.  She was prescribed Tylenol 3 by her dentist but has not been able to pick up the prescription as of yet.  Home Medications Prior to Admission medications   Medication Sig Start Date End Date Taking? Authorizing Provider  amoxicillin-clavulanate (AUGMENTIN) 875-125 MG tablet Take 1 tablet by mouth 2 (two) times daily. 06/06/21   Waldon Merl, PA-C  etonogestrel (NEXPLANON) 68 MG IMPL implant 68 mg.    [provider]  meloxicam (MOBIC) 15 MG tablet Take 1 tablet (15 mg total) by mouth daily. 06/06/21   Waldon Merl, PA-C      Allergies    Patient has no known allergies.    Review of Systems   Review of Systems  Constitutional:  Negative for chills and fever.  HENT:  Positive for dental problem. Negative for drooling and trouble swallowing.    Physical Exam Updated Vital Signs BP (!) 156/98 (BP Location: Right Arm)    Pulse (!) 101    Temp 97.7 F (36.5 C) (Oral)    Resp 19    SpO2 99%  Physical Exam Vitals and nursing note reviewed.  Constitutional:      General: She is not in acute distress.    Appearance: She is well-developed.  HENT:      Head: Normocephalic and atraumatic.     Right Ear: External ear normal.     Left Ear: External ear normal.     Nose: Nose normal.     Mouth/Throat:     Mouth: Mucous membranes are moist.     Pharynx: Oropharynx is clear. No oropharyngeal exudate or posterior oropharyngeal erythema.     Comments: Moderate tenderness noted with manipulation of the left lower first molar.  No surrounding fluctuance or erythema.  Uvula midline.  Readily handling secretions.  No hot potato voice.  Soft submental compartments. Eyes:     General: No scleral icterus.       Right eye: No discharge.        Left eye: No discharge.     Conjunctiva/sclera: Conjunctivae normal.  Neck:     Trachea: No tracheal deviation.  Cardiovascular:     Rate and Rhythm: Normal rate.  Pulmonary:     Effort: Pulmonary effort is normal. No respiratory distress.     Breath sounds: No stridor.  Abdominal:     General: There is no distension.  Musculoskeletal:        General: No swelling or deformity.     Cervical back: Neck supple.  Skin:    General: Skin is warm and dry.     Findings: No rash.  Neurological:     General: No focal deficit present.     Mental Status: She is alert and oriented to person, place, and time.     Cranial Nerves: Cranial nerve deficit: no gross deficits.  Psychiatric:        Mood and Affect: Mood normal.        Behavior: Behavior normal.   ED Results / Procedures / Treatments   Labs (all labs ordered are listed, but only abnormal results are displayed) Labs Reviewed - No data to display  EKG None  Radiology No results found.  Procedures Procedures   Medications Ordered in ED Medications  HYDROcodone-acetaminophen (NORCO/VICODIN) 5-325 MG per tablet 1 tablet (has no administration in time range)    ED Course/ Medical Decision Making/ A&P                           Medical Decision Making Risk Prescription drug management.  Patient is a 21 year old female who presents to the  emergency department due to left lower dental pain.  Patient has been experiencing this pain intermittently for the past year and was having a local dentist extract the tooth this morning but could not tolerate the procedure and went home prior to the tooth being extracted.  On my exam patient has moderate tenderness along the left lower first molar.  No surrounding erythema or fluctuance.  Uvula midline.  Readily handling secretions.  Soft submental compartments.  Doubt Ludwig's angina or PTA at this time.  Pain appears to likely be secondary from her procedure earlier today.  Patient is started Augmentin as well as Mobic with little relief.  Recommended that she continue these medications.  Also recommended continued use of Orajel as well as salt water gargles.  She states she was prescribed Tylenol 3 but was unable to pick up this medication today.  Patient given a single dose of Vicodin in the ED for acute pain.  She has a ride home.  We discussed return precautions.  Patient given a new dental referral.  Feel that she is stable for discharge at this time and she is agreeable.  All questions were answered and she was amicable at the time of discharge. Final Clinical Impression(s) / ED Diagnoses Final diagnoses:  Pain, dental   Rx / DC Orders ED Discharge Orders     None         Placido Sou, PA-C 06/06/21 2326    Gloris Manchester, MD 06/07/21 [phone]

## 2021-06-06 NOTE — Progress Notes (Signed)
Virtual Visit Consent   [facility], you are scheduled for a virtual visit with a [facility] provider today.     Just as with appointments in the office, your consent must be obtained to participate.  Your consent will be active for this visit and any virtual visit you may have with one of our providers in the next 365 days.     If you have a MyChart account, a copy of this consent can be sent to you electronically.  All virtual visits are billed to your insurance company just like a traditional visit in the office.    As this is a virtual visit, video technology does not allow for your provider to perform a traditional examination.  This may limit your provider's ability to fully assess your condition.  If your provider identifies any concerns that need to be evaluated in person or the need to arrange testing (such as labs, EKG, etc.), we will make arrangements to do so.     Although advances in technology are sophisticated, we cannot ensure that it will always work on either your end or our end.  If the connection with a video visit is poor, the visit may have to be switched to a telephone visit.  With either a video or telephone visit, we are not always able to ensure that we have a secure connection.     I need to obtain your verbal consent now.   Are you willing to proceed with your visit today?    Sandra Drake has provided verbal consent on 06/06/2021 for a virtual visit (video or telephone).   Piedad Climes, [place]   Date: 06/06/2021 5:27 PM   Virtual Visit via Video Note   I, Piedad Climes, connected with  [facility]  ([id number], 2001/03/25) on 06/06/21 at  5:15 PM EST by a video-enabled telemedicine application and verified that I am speaking with the correct person using two identifiers.  Location: Patient: Virtual Visit Location Patient: Home Provider: Virtual Visit Location Provider: Home Office   I discussed the limitations of  evaluation and management by telemedicine and the availability of in person appointments. The patient expressed understanding and agreed to proceed.    History of Present Illness: Sandra Drake is a 21 y.o. who identifies as a female who was assigned female at birth, and is being seen today for 1 week of so of worsening pain of premolar tooth of bottom left. Has had issue with this tooth in the past including abscess, cavities, chipped tooth. Got a filling in the area about a year ago. Had had occasional pain in the area but would quickly dissipate. Tried to go to the dentist today to get extracted but was unsuccessful. They did not have anyone there who could put her to sleep to complete the procedure. Denies fever, chills.   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  Resting comfortably at  home.  Head is normocephalic, atraumatic.  No labored breathing. Speech is clear and coherent with logical content.  Patient is alert and oriented at baseline.   Assessment and Plan: 1. Pain, dental - amoxicillin-clavulanate (AUGMENTIN) 875-125 MG tablet; Take 1 tablet by mouth 2 (two) times daily.  Dispense: 14 tablet; Refill: 0 - meloxicam (MOBIC) 15 MG tablet; Take 1 tablet (15 mg total) by mouth daily.  Dispense: 30 tablet; Refill: 0  Possible infection versus overactive nerve. Was unable to get full assessment via dentist today due to  inability to tolerate exam. Cannot get in with oral surgeon until April. Will start Augmentin and start her Meloxicam to help ease pain so hopefully she can revisit dental exam and possible extraction. ER precautions reviewed.   Follow Up Instructions: I discussed the assessment and treatment plan with the patient. The patient was provided an opportunity to ask questions and all were answered. The patient agreed with the plan and demonstrated an understanding of the instructions.  A copy of instructions were sent to the patient via MyChart unless otherwise noted below.   The patient was advised to call back or seek an in-person evaluation if the symptoms worsen or if the condition fails to improve as anticipated.  Time:  I spent 10 minutes with the patient via telehealth technology discussing the above problems/concerns.    Piedad Climes, PA-C
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
## 2021-06-12 ENCOUNTER — Ambulatory Visit: Payer: Medicaid Other | Admitting: Critical Care Medicine

## 2021-06-14 ENCOUNTER — Ambulatory Visit: Payer: Medicaid Other | Admitting: Critical Care Medicine

## 2021-06-15 ENCOUNTER — Ambulatory Visit: Payer: Medicaid Other | Admitting: Critical Care Medicine

## 2021-06-20 ENCOUNTER — Telehealth: Payer: Medicaid Other | Admitting: Physician Assistant

## 2021-06-20 DIAGNOSIS — T3695XA Adverse effect of unspecified systemic antibiotic, initial encounter: Secondary | ICD-10-CM

## 2021-06-20 DIAGNOSIS — K2900 Acute gastritis without bleeding: Secondary | ICD-10-CM

## 2021-06-20 DIAGNOSIS — B379 Candidiasis, unspecified: Secondary | ICD-10-CM | POA: Diagnosis not present

## 2021-06-20 DIAGNOSIS — R051 Acute cough: Secondary | ICD-10-CM

## 2021-06-20 MED ORDER — PREDNISONE 20 MG PO TABS: 20.0000 mg | ORAL_TABLET | Freq: Every day | ORAL | 0 refills | Status: AC

## 2021-06-20 MED ORDER — SUCRALFATE 1 G PO TABS: 1.0000 g | ORAL_TABLET | Freq: Three times a day (TID) | ORAL | 0 refills | Status: AC

## 2021-06-20 MED ORDER — BENZONATATE 100 MG PO CAPS: 100.0000 mg | ORAL_CAPSULE | Freq: Three times a day (TID) | ORAL | 0 refills | Status: AC | PRN

## 2021-06-20 MED ORDER — FLUCONAZOLE 150 MG PO TABS
150.0000 mg | ORAL_TABLET | Freq: Once | ORAL | 0 refills | Status: AC
Start: 2021-06-20 — End: 2021-06-20

## 2021-06-20 NOTE — Patient Instructions (Signed)
?[facility], thank you for joining Margaretann Loveless, PA-C for today's virtual visit.  While this provider is not your primary care provider (PCP), if your PCP is located in our provider database this encounter information will be shared with them immediately following your visit. ? ?Consent: ?(Patient) [facility] provided verbal consent for this virtual visit at the beginning of the encounter. ? ?Current Medications: ? ?Current Outpatient Medications:  ?  benzonatate (TESSALON) 100 MG capsule, Take 1 capsule (100 mg total) by mouth 3 (three) times daily as needed., Disp: 30 capsule, Rfl: 0 ?  fluconazole (DIFLUCAN) 150 MG tablet, Take 1 tablet (150 mg total) by mouth once for 1 dose., Disp: 1 tablet, Rfl: 0 ?  predniSONE (DELTASONE) 20 MG tablet, Take 1 tablet (20 mg total) by mouth daily with breakfast., Disp: 5 tablet, Rfl: 0 ?  sucralfate (CARAFATE) 1 g tablet, Take 1 tablet (1 g total) by mouth 4 (four) times daily -  with meals and at bedtime., Disp: 40 tablet, Rfl: 0 ?  chlorhexidine (PERIDEX) 0.12 % solution, SMARTSIG:By Mouth, Disp: , Rfl:  ?  etonogestrel (NEXPLANON) 68 MG IMPL implant, 68 mg., Disp: , Rfl:  ?  meloxicam (MOBIC) 15 MG tablet, Take 1 tablet (15 mg total) by mouth daily., Disp: 30 tablet, Rfl: 0  ? ?Medications ordered in this encounter:  ?Meds ordered this encounter  ?Medications  ? sucralfate (CARAFATE) 1 g tablet  ?  Sig: Take 1 tablet (1 g total) by mouth 4 (four) times daily -  with meals and at bedtime.  ?  Dispense:  40 tablet  ?  Refill:  0  ?  Order Specific Question:   Supervising Provider  ?  Answer:   Eber Hong [[id number]]  ? predniSONE (DELTASONE) 20 MG tablet  ?  Sig: Take 1 tablet (20 mg total) by mouth daily with breakfast.  ?  Dispense:  5 tablet  ?  Refill:  0  ?  Order Specific Question:   Supervising Provider  ?  Answer:   Eber Hong [[id number]]  ? fluconazole (DIFLUCAN) 150 MG tablet  ?  Sig: Take 1 tablet (150 mg total) by mouth once for 1  dose.  ?  Dispense:  1 tablet  ?  Refill:  0  ?  Order Specific Question:   Supervising Provider  ?  Answer:   Eber Hong [[id number]]  ? benzonatate (TESSALON) 100 MG capsule  ?  Sig: Take 1 capsule (100 mg total) by mouth 3 (three) times daily as needed.  ?  Dispense:  30 capsule  ?  Refill:  0  ?  Order Specific Question:   Supervising Provider  ?  Answer:   Eber Hong [[id number]]  ?  ? ?*If you need refills on other medications prior to your next appointment, please contact your pharmacy* ? ?Follow-Up: ?Call back or seek an in-person evaluation if the symptoms worsen or if the condition fails to improve as anticipated. ? ?Other Instructions ?Cough, Adult ?Coughing is a reflex that clears your throat and your airways (respiratory system). Coughing helps to heal and protect your lungs. It is normal to cough occasionally, but a cough that happens with other symptoms or lasts a long time may be a sign of a condition that needs treatment. An acute cough may only last 2-3 weeks, while a chronic cough may last 8 or more weeks. ?Coughing is commonly caused by: ?Infection of the respiratory systemby viruses or bacteria. ?Breathing in substances  that irritate your lungs. ?Allergies. ?Asthma. ?Mucus that runs down the back of your throat (postnasal drip). ?Smoking. ?Acid backing up from the stomach into the esophagus (gastroesophageal reflux). ?Certain medicines. ?Chronic lung problems. ?Other medical conditions such as heart failure or a blood clot in the lung (pulmonary embolism). ?Follow these instructions at home: ?Medicines ?Take over-the-counter and prescription medicines only as told by your health care provider. ?Talk with your health care provider before you take a cough suppressant medicine. ?Lifestyle ? ?Avoid cigarette smoke. Do not use any products that contain nicotine or tobacco, such as cigarettes, e-cigarettes, and chewing tobacco. If you need help quitting, ask your health care provider. ?Drink enough fluid to  keep your urine pale yellow. ?Avoid caffeine. ?Do not drink alcohol if your health care provider tells you not to drink. ?General instructions ? ?Pay close attention to changes in your cough. Tell your health care provider about them. ?Always cover your mouth when you cough. ?Avoid things that make you cough, such as perfume, candles, cleaning products, or campfire or tobacco smoke. ?If the air is dry, use a cool mist vaporizer or humidifier in your bedroom or your home to help loosen secretions. ?If your cough is worse at night, try to sleep in a semi-upright position. ?Rest as needed. ?Keep all follow-up visits as told by your health care provider. This is important. ?Contact a health care provider if you: ?Have new symptoms. ?Cough up pus. ?Have a cough that does not get better after 2-3 weeks or gets worse. ?Cannot control your cough with cough suppressant medicines and you are losing sleep. ?Have pain that gets worse or pain that is not helped with medicine. ?Have a fever. ?Have unexplained weight loss. ?Have night sweats. ?Get help right away if: ?You cough up blood. ?You have difficulty breathing. ?Your heartbeat is very fast. ?These symptoms may represent a serious problem that is an emergency. Do not wait to see if the symptoms will go away. Get medical help right away. Call your local emergency services (911 in the [place] Do not drive yourself to the hospital. ?Summary ?Coughing is a reflex that clears your throat and your airways. It is normal to cough occasionally, but a cough that happens with other symptoms or lasts a long time may be a sign of a condition that needs treatment. ?Take over-the-counter and prescription medicines only as told by your health care provider. ?Always cover your mouth when you cough. ?Contact a health care provider if you have new symptoms or a cough that does not get better after 2-3 weeks or gets worse. ?This information is not intended to replace advice given to you by your  health care provider. Make sure you discuss any questions you have with your health care provider. ?Document Revised: 04/21/2018 Document Reviewed: 04/21/2018 ?Elsevier Patient Education ? 2022 Elsevier Inc. ? ? ? ?If you have been instructed to have an in-person evaluation today at a local Urgent Care facility, please use the link below. It will take you to a list of all of our available [facility], including address, phone number and hours of operation. Please do not delay care.  ?[facility] ? ?If you or a family member do not have a primary care provider, use the link below to schedule a visit and establish care. When you choose a  primary care physician or advanced practice provider, you gain a long-term partner in health. ?Find a Primary Care Provider ? ?Learn more about  [facility]'s in-office and virtual care options: ?[facility] - Get Care Now ?

## 2021-06-20 NOTE — Progress Notes (Signed)
?Virtual Visit Consent  ? ?[facility], you are scheduled for a virtual visit with a [facility] provider today.   ?  ?Just as with appointments in the office, your consent must be obtained to participate.  Your consent will be active for this visit and any virtual visit you may have with one of our providers in the next 365 days.   ?  ?If you have a MyChart account, a copy of this consent can be sent to you electronically.  All virtual visits are billed to your insurance company just like a traditional visit in the office.   ? ?As this is a virtual visit, video technology does not allow for your provider to perform a traditional examination.  This may limit your provider's ability to fully assess your condition.  If your provider identifies any concerns that need to be evaluated in person or the need to arrange testing (such as labs, EKG, etc.), we will make arrangements to do so.   ?  ?Although advances in technology are sophisticated, we cannot ensure that it will always work on either your end or our end.  If the connection with a video visit is poor, the visit may have to be switched to a telephone visit.  With either a video or telephone visit, we are not always able to ensure that we have a secure connection.    ? ?I need to obtain your verbal consent now.   Are you willing to proceed with your visit today?  ?  ?[facility] has provided verbal consent on 06/20/2021 for a virtual visit (video or telephone). ?  ?Margaretann Loveless, PA-C  ? ?Date: 06/20/2021 12:35 PM ? ? ?Virtual Visit via Video Note  ? ?Sandra Drake, connected with  [facility]  ([id number], Jan 25, 2001) on 06/20/21 at 12:30 PM EST by a video-enabled telemedicine application and verified that I am speaking with the correct person using two identifiers. ? ?Location: ?Patient: Virtual Visit Location Patient: Home ?Provider: Virtual Visit Location Provider: Home Office ?  ?I discussed the limitations of  evaluation and management by telemedicine and the availability of in person appointments. The patient expressed understanding and agreed to proceed.   ? ?History of Present Illness: ?Sandra Drake is a 21 y.o. who identifies as a female who was assigned female at birth, and is being seen today for cough. Has been ongoing for 2 weeks. Reports feels like chest itching. Having congestion and rhinorrhea. Reports feels like pepper in her nose, having burning in nose and throat. Has had nausea and diarrhea. Denies fevers and chills. Recently on antibiotic for pain dental.  ? ?Problems:  ?Patient Active Problem List  ? Diagnosis Date Noted  ? Class 3 severe obesity without serious comorbidity with body mass index (BMI) of 40.0 to 44.9 in adult [facility]) 11/10/2019  ? Intentional drug overdose (HCC) 11/10/2019  ? Cannabis use disorder, mild, abuse 11/08/2019  ? GAD (generalized anxiety disorder) 11/08/2019  ? MDD (major depressive disorder), recurrent severe, without psychosis (HCC) 11/08/2019  ? Suicide attempt (HCC) 11/06/2019  ?  ?Allergies: No Known Allergies ?Medications:  ?Current Outpatient Medications:  ?  benzonatate (TESSALON) 100 MG capsule, Take 1 capsule (100 mg total) by mouth 3 (three) times daily as needed., Disp: 30 capsule, Rfl: 0 ?  fluconazole (DIFLUCAN) 150 MG tablet, Take 1 tablet (150 mg total) by mouth once for 1 dose., Disp: 1 tablet, Rfl: 0 ?  predniSONE (DELTASONE) 20 MG tablet,  Take 1 tablet (20 mg total) by mouth daily with breakfast., Disp: 5 tablet, Rfl: 0 ?  sucralfate (CARAFATE) 1 g tablet, Take 1 tablet (1 g total) by mouth 4 (four) times daily -  with meals and at bedtime., Disp: 40 tablet, Rfl: 0 ?  chlorhexidine (PERIDEX) 0.12 % solution, SMARTSIG:By Mouth, Disp: , Rfl:  ?  etonogestrel (NEXPLANON) 68 MG IMPL implant, 68 mg., Disp: , Rfl:  ?  meloxicam (MOBIC) 15 MG tablet, Take 1 tablet (15 mg total) by mouth daily., Disp: 30 tablet, Rfl: 0 ? ?Observations/Objective: ?Patient is  well-developed, well-nourished in no acute distress.  ?Resting comfortably at home.  ?Head is normocephalic, atraumatic.  ?No labored breathing.  ?Speech is clear and coherent with logical content.  ?Patient is alert and oriented at baseline.  ?Dry, barking cough heard a few times during call ? ?Assessment and Plan: ?1. Acute gastritis without hemorrhage, unspecified gastritis type ?- sucralfate (CARAFATE) 1 g tablet; Take 1 tablet (1 g total) by mouth 4 (four) times daily -  with meals and at bedtime.  Dispense: 40 tablet; Refill: 0 ? ?2. Acute cough ?- predniSONE (DELTASONE) 20 MG tablet; Take 1 tablet (20 mg total) by mouth daily with breakfast.  Dispense: 5 tablet; Refill: 0 ?- benzonatate (TESSALON) 100 MG capsule; Take 1 capsule (100 mg total) by mouth 3 (three) times daily as needed.  Dispense: 30 capsule; Refill: 0 ? ?3. Antibiotic-induced yeast infection ?- fluconazole (DIFLUCAN) 150 MG tablet; Take 1 tablet (150 mg total) by mouth once for 1 dose.  Dispense: 1 tablet; Refill: 0 ? ?- Symptoms of cough with burning in the throat and nose and associated nausea and diarrhea could be consistent with gastritis/irritation from recent antibiotic use ?- Will use sucralfate as noted above ?- Also will give prednisone and tessalon perles for the acute cough ?- Diflucan for possible antibiotic associated yeast infection from recent augmentin use for a dental infection ?- Advised if symptoms continue to worsen or if they fail to resolve with treatment please reach back out or seek in person evaluation for further management ? ?Follow Up Instructions: ?I discussed the assessment and treatment plan with the patient. The patient was provided an opportunity to ask questions and all were answered. The patient agreed with the plan and demonstrated an understanding of the instructions.  A copy of instructions were sent to the patient via MyChart unless otherwise noted below.  ? ?The patient was advised to call back or seek an  in-person evaluation if the symptoms worsen or if the condition fails to improve as anticipated. ? ?Time:  ?I spent 12 minutes with the patient via telehealth technology discussing the above problems/concerns.   ? ?Margaretann Loveless, PA-C ? ?

## 2021-07-12 ENCOUNTER — Telehealth: Payer: Medicaid Other | Admitting: Physician Assistant

## 2021-07-12 DIAGNOSIS — H6981 Other specified disorders of Eustachian tube, right ear: Secondary | ICD-10-CM

## 2021-07-12 DIAGNOSIS — J019 Acute sinusitis, unspecified: Secondary | ICD-10-CM | POA: Diagnosis not present

## 2021-07-12 DIAGNOSIS — K219 Gastro-esophageal reflux disease without esophagitis: Secondary | ICD-10-CM

## 2021-07-12 DIAGNOSIS — B9689 Other specified bacterial agents as the cause of diseases classified elsewhere: Secondary | ICD-10-CM | POA: Diagnosis not present

## 2021-07-12 MED ORDER — FLUTICASONE PROPIONATE 50 MCG/ACT NA SUSP: 2.0000 | Freq: Every day | NASAL | 0 refills | Status: AC

## 2021-07-12 MED ORDER — OMEPRAZOLE 40 MG PO CPDR: 40.0000 mg | DELAYED_RELEASE_CAPSULE | Freq: Every day | ORAL | 0 refills | Status: AC

## 2021-07-12 MED ORDER — DOXYCYCLINE HYCLATE 100 MG PO TABS: 100.0000 mg | ORAL_TABLET | Freq: Two times a day (BID) | ORAL | 0 refills | Status: AC

## 2021-07-12 NOTE — Patient Instructions (Addendum)
?[facility], thank you for joining Margaretann Loveless, PA-C for today's virtual visit.  While this provider is not your primary care provider (PCP), if your PCP is located in our provider database this encounter information will be shared with them immediately following your visit. ? ?Consent: ?(Patient) [facility] provided verbal consent for this virtual visit at the beginning of the encounter. ? ?Current Medications: ? ?Current Outpatient Medications:  ?  doxycycline (VIBRA-TABS) 100 MG tablet, Take 1 tablet (100 mg total) by mouth 2 (two) times daily., Disp: 20 tablet, Rfl: 0 ?  fluticasone (FLONASE) 50 MCG/ACT nasal spray, Place 2 sprays into both nostrils daily., Disp: 16 g, Rfl: 0 ?  omeprazole (PRILOSEC) 40 MG capsule, Take 1 capsule (40 mg total) by mouth daily., Disp: 30 capsule, Rfl: 0 ?  benzonatate (TESSALON) 100 MG capsule, Take 1 capsule (100 mg total) by mouth 3 (three) times daily as needed., Disp: 30 capsule, Rfl: 0 ?  chlorhexidine (PERIDEX) 0.12 % solution, SMARTSIG:By Mouth, Disp: , Rfl:  ?  etonogestrel (NEXPLANON) 68 MG IMPL implant, 68 mg., Disp: , Rfl:  ?  meloxicam (MOBIC) 15 MG tablet, Take 1 tablet (15 mg total) by mouth daily., Disp: 30 tablet, Rfl: 0 ?  predniSONE (DELTASONE) 20 MG tablet, Take 1 tablet (20 mg total) by mouth daily with breakfast., Disp: 5 tablet, Rfl: 0 ?  sucralfate (CARAFATE) 1 g tablet, Take 1 tablet (1 g total) by mouth 4 (four) times daily -  with meals and at bedtime., Disp: 40 tablet, Rfl: 0  ? ?Medications ordered in this encounter:  ?Meds ordered this encounter  ?Medications  ? fluticasone (FLONASE) 50 MCG/ACT nasal spray  ?  Sig: Place 2 sprays into both nostrils daily.  ?  Dispense:  16 g  ?  Refill:  0  ?  Order Specific Question:   Supervising Provider  ?  Answer:   Eber Hong [[id number]]  ? doxycycline (VIBRA-TABS) 100 MG tablet  ?  Sig: Take 1 tablet (100 mg total) by mouth 2 (two) times daily.  ?  Dispense:  20 tablet  ?   Refill:  0  ?  Order Specific Question:   Supervising Provider  ?  Answer:   Eber Hong [[id number]]  ? omeprazole (PRILOSEC) 40 MG capsule  ?  Sig: Take 1 capsule (40 mg total) by mouth daily.  ?  Dispense:  30 capsule  ?  Refill:  0  ?  Order Specific Question:   Supervising Provider  ?  Answer:   Eber Hong [[id number]]  ?  ? ?*If you need refills on other medications prior to your next appointment, please contact your pharmacy* ? ?Follow-Up: ?Call back or seek an in-person evaluation if the symptoms worsen or if the condition fails to improve as anticipated. ? ?Other Instructions ? ?Eustachian Tube Dysfunction ?Eustachian tube dysfunction refers to a condition in which a blockage develops in the narrow passage that connects the middle ear to the back of the nose (eustachian tube). The eustachian tube regulates air pressure in the middle ear by letting air move between the ear and nose. It also helps to drain fluid from the middle ear space. ?Eustachian tube dysfunction can affect one or both ears. When the eustachian tube does not function properly, air pressure, fluid, or both can build up in the middle ear. ?What are the causes? ?This condition occurs when the eustachian tube becomes blocked or cannot open normally. Common causes of this condition include: ?  Ear infections. ?Colds and other infections that affect the nose, mouth, and throat (upper respiratory tract). ?Allergies. ?Irritation from cigarette smoke. ?Irritation from stomach acid coming up into the esophagus (gastroesophageal reflux). The esophagus is the part of the body that moves food from the mouth to the stomach. ?Sudden changes in air pressure, such as from descending in an airplane or scuba diving. ?Abnormal growths in the nose or throat, such as: ?Growths that line the nose (nasal polyps). ?Abnormal growth of cells (tumors). ?Enlarged tissue at the back of the throat (adenoids). ?What increases the risk? ?You are more likely to develop this  condition if: ?You smoke. ?You are overweight. ?You are a child who has: ?Certain birth defects of the mouth, such as cleft palate. ?Large tonsils or adenoids. ?What are the signs or symptoms? ?Common symptoms of this condition include: ?A feeling of fullness in the ear. ?Ear pain. ?Clicking or popping noises in the ear. ?Ringing in the ear (tinnitus). ?Hearing loss. ?Loss of balance. ?Dizziness. ?Symptoms may get worse when the air pressure around you changes, such as when you travel to an area of high elevation, fly on an airplane, or go scuba diving. ?How is this diagnosed? ?This condition may be diagnosed based on: ?Your symptoms. ?A physical exam of your ears, nose, and throat. ?Tests, such as those that measure: ?The movement of your eardrum. ?Your hearing (audiometry). ?How is this treated? ?Treatment depends on the cause and severity of your condition. ?In mild cases, you may relieve your symptoms by moving air into your ears. This is called "popping the ears." ?In more severe cases, or if you have symptoms of fluid in your ears, treatment may include: ?Medicines to relieve congestion (decongestants). ?Medicines that treat allergies (antihistamines). ?Nasal sprays or ear drops that contain medicines that reduce swelling (steroids). ?A procedure to drain the fluid in your eardrum. In this procedure, a small tube may be placed in the eardrum to: ?Drain the fluid. ?Restore the air in the middle ear space. ?A procedure to insert a balloon device through the nose to inflate the opening of the eustachian tube (balloon dilation). ?Follow these instructions at home: ?Lifestyle ?Do not do any of the following until your health care provider approves: ?Travel to high altitudes. ?Fly in airplanes. ?Work in a Estate agent or room. ?Scuba dive. ?Do not use any products that contain nicotine or tobacco. These products include cigarettes, chewing tobacco, and vaping devices, such as e-cigarettes. If you need help  quitting, ask your health care provider. ?Keep your ears dry. Wear fitted earplugs during showering and bathing. Dry your ears completely after. ?General instructions ?Take over-the-counter and prescription medicines only as told by your health care provider. ?Use techniques to help pop your ears as recommended by your health care provider. These may include: ?Chewing gum. ?Yawning. ?Frequent, forceful swallowing. ?Closing your mouth, holding your nose closed, and gently blowing as if you are trying to blow air out of your nose. ?Keep all follow-up visits. This is important. ?Contact a health care provider if: ?Your symptoms do not go away after treatment. ?Your symptoms come back after treatment. ?You are unable to pop your ears. ?You have: ?A fever. ?Pain in your ear. ?Pain in your head or neck. ?Fluid draining from your ear. ?Your hearing suddenly changes. ?You become very dizzy. ?You lose your balance. ?Get help right away if: ?You have a sudden, severe increase in any of your symptoms. ?Summary ?Eustachian tube dysfunction refers to a condition in which a  blockage develops in the eustachian tube. ?It can be caused by ear infections, allergies, inhaled irritants, or abnormal growths in the nose or throat. ?Symptoms may include ear pain or fullness, hearing loss, or ringing in the ears. ?Mild cases are treated with techniques to unblock the ears, such as yawning or chewing gum. ?More severe cases are treated with medicines or procedures. ?This information is not intended to replace advice given to you by your health care provider. Make sure you discuss any questions you have with your health care provider. ?Document Revised: 06/13/2020 Document Reviewed: 06/13/2020 ?Elsevier Patient Education ? 2022 Elsevier Inc. ? ? ?Food Choices for Gastroesophageal Reflux Disease, Adult ?When you have gastroesophageal reflux disease (GERD), the foods you eat and your eating habits are very important. Choosing the right foods  can help ease your discomfort. Think about working with a food expert (dietitian) to help you make good choices. ?What are tips for following this plan? ?Reading food labels ?Look for foods that are low in saturated f

## 2021-07-12 NOTE — Progress Notes (Signed)
?Virtual Visit Consent  ? ?[facility], you are scheduled for a virtual visit with a [facility] provider today.   ?  ?Just as with appointments in the office, your consent must be obtained to participate.  Your consent will be active for this visit and any virtual visit you may have with one of our providers in the next 365 days.   ?  ?If you have a MyChart account, a copy of this consent can be sent to you electronically.  All virtual visits are billed to your insurance company just like a traditional visit in the office.   ? ?As this is a virtual visit, video technology does not allow for your provider to perform a traditional examination.  This may limit your provider's ability to fully assess your condition.  If your provider identifies any concerns that need to be evaluated in person or the need to arrange testing (such as labs, EKG, etc.), we will make arrangements to do so.   ?  ?Although advances in technology are sophisticated, we cannot ensure that it will always work on either your end or our end.  If the connection with a video visit is poor, the visit may have to be switched to a telephone visit.  With either a video or telephone visit, we are not always able to ensure that we have a secure connection.    ? ?I need to obtain your verbal consent now.   Are you willing to proceed with your visit today?  ?  ?[facility] has provided verbal consent on 07/12/2021 for a virtual visit (video or telephone). ?  ?Margaretann Loveless, PA-C  ? ?Date: 07/12/2021 10:11 AM ? ? ?Virtual Visit via Video Note  ? ?Sandra Drake, connected with  [facility]  ([id number], Apr 13, 2001) on 07/12/21 at  9:15 AM EDT by a video-enabled telemedicine application and verified that I am speaking with the correct person using two identifiers. ? ?Location: ?Patient: Virtual Visit Location Patient: Home ?Provider: Virtual Visit Location Provider: Home Office ?  ?I discussed the limitations of  evaluation and management by telemedicine and the availability of in person appointments. The patient expressed understanding and agreed to proceed.   ? ?History of Present Illness: ?Sandra Drake is a 21 y.o. who identifies as a female who was assigned female at birth, and is being seen today for continued cough. Last seen on 06/20/2021, virtually. Was given prednisone and tessalon perles. At that visit she had also recently finished augmentin for a dental infection on 06/15/21 so another antibiotic had not been started. Reports coming in waves. Symptoms started worsening since last week. Still reports burning in throat. Having increased DOE. Having associated sinus pressure. Feeling off balance today. Having ear fullness in right ear.  ? ? ?Problems:  ?Patient Active Problem List  ? Diagnosis Date Noted  ? Class 3 severe obesity without serious comorbidity with body mass index (BMI) of 40.0 to 44.9 in adult [facility]) 11/10/2019  ? Intentional drug overdose (HCC) 11/10/2019  ? Cannabis use disorder, mild, abuse 11/08/2019  ? GAD (generalized anxiety disorder) 11/08/2019  ? MDD (major depressive disorder), recurrent severe, without psychosis (HCC) 11/08/2019  ? Suicide attempt (HCC) 11/06/2019  ?  ?Allergies: No Known Allergies ?Medications:  ?Current Outpatient Medications:  ?  doxycycline (VIBRA-TABS) 100 MG tablet, Take 1 tablet (100 mg total) by mouth 2 (two) times daily., Disp: 20 tablet, Rfl: 0 ?  fluticasone (FLONASE) 50 MCG/ACT nasal spray,  Place 2 sprays into both nostrils daily., Disp: 16 g, Rfl: 0 ?  omeprazole (PRILOSEC) 40 MG capsule, Take 1 capsule (40 mg total) by mouth daily., Disp: 30 capsule, Rfl: 0 ?  benzonatate (TESSALON) 100 MG capsule, Take 1 capsule (100 mg total) by mouth 3 (three) times daily as needed., Disp: 30 capsule, Rfl: 0 ?  chlorhexidine (PERIDEX) 0.12 % solution, SMARTSIG:By Mouth, Disp: , Rfl:  ?  etonogestrel (NEXPLANON) 68 MG IMPL implant, 68 mg., Disp: , Rfl:  ?   meloxicam (MOBIC) 15 MG tablet, Take 1 tablet (15 mg total) by mouth daily., Disp: 30 tablet, Rfl: 0 ?  predniSONE (DELTASONE) 20 MG tablet, Take 1 tablet (20 mg total) by mouth daily with breakfast., Disp: 5 tablet, Rfl: 0 ?  sucralfate (CARAFATE) 1 g tablet, Take 1 tablet (1 g total) by mouth 4 (four) times daily -  with meals and at bedtime., Disp: 40 tablet, Rfl: 0 ? ?Observations/Objective: ?Patient is well-developed, well-nourished in no acute distress.  ?Resting comfortably at home.  ?Head is normocephalic, atraumatic.  ?No labored breathing.  ?Speech is clear and coherent with logical content.  ?Patient is alert and oriented at baseline.  ? ? ?Assessment and Plan: ?1. Dysfunction of right eustachian tube ?- fluticasone (FLONASE) 50 MCG/ACT nasal spray; Place 2 sprays into both nostrils daily.  Dispense: 16 g; Refill: 0 ? ?2. Acute bacterial sinusitis ?- doxycycline (VIBRA-TABS) 100 MG tablet; Take 1 tablet (100 mg total) by mouth 2 (two) times daily.  Dispense: 20 tablet; Refill: 0 ? ?3. Laryngopharyngeal reflux ?- omeprazole (PRILOSEC) 40 MG capsule; Take 1 capsule (40 mg total) by mouth daily.  Dispense: 30 capsule; Refill: 0 ? ?- Suspect multiple issues ?- ETD right ear, flonase prescribed; Advised to use daily x 2 weeks ?- Doxycycline for possible sinus infection, sinus congestion and pain over 3 weeks ?- Omeprazole for laryngeal reflux (burning in throat); daily use x 1 month ?- Seek in person evaluation if not improving ? ?Follow Up Instructions: ?I discussed the assessment and treatment plan with the patient. The patient was provided an opportunity to ask questions and all were answered. The patient agreed with the plan and demonstrated an understanding of the instructions.  A copy of instructions were sent to the patient via MyChart unless otherwise noted below.  ? ? ?The patient was advised to call back or seek an in-person evaluation if the symptoms worsen or if the condition fails to improve as  anticipated. ? ?Time:  ?I spent 18 minutes with the patient via telehealth technology discussing the above problems/concerns.   ? ?Margaretann Loveless, PA-C ?

## 2021-08-01 ENCOUNTER — Telehealth: Payer: Medicaid Other | Admitting: Family Medicine

## 2021-08-01 DIAGNOSIS — L732 Hidradenitis suppurativa: Secondary | ICD-10-CM

## 2021-08-01 NOTE — Patient Instructions (Signed)
Hidradenitis Suppurativa Hidradenitis suppurativa is a long-term (chronic) skin disease. It is similar to a severe form of acne, but it affects areas of the body where acne would be unusual, especially areas of the body where skin rubs against skin and becomes moist. These include: Underarms. Groin. Genital area. Buttocks. Upper thighs. Breasts. Hidradenitis suppurativa may start out as small lumps or pimples caused by blocked sweat glands or hair follicles. Pimples may develop into deep sores that break open (rupture) and drain pus. Over time, affected areas of skin may thicken and become scarred. This condition is rare and does not spread from person to person (non-contagious). What are the causes? The exact cause of this condition is not known. It may be related to: Female and female hormones. An overactive disease-fighting system (immune system). The immune system may over-react to blocked hair follicles or sweat glands and cause swelling and pus-filled sores. What increases the risk? You are more likely to develop this condition if you: Are female. Are 11-55 years old. Have a family history of hidradenitis suppurativa. Have a personal history of acne. Are overweight. Smoke. Take the medicine lithium. What are the signs or symptoms? The first symptoms are usually painful bumps in the skin, similar to pimples. The condition may get worse over time (progress), or it may only cause mild symptoms. If the disease progresses, symptoms may include: Skin bumps getting bigger and growing deeper into the skin. Bumps rupturing and draining pus. Itchy, infected skin. Skin getting thicker and scarred. Tunnels under the skin (fistulas) where pus drains from a bump. Pain during daily activities, such as pain during walking if your groin area is affected. Emotional problems, such as stress or depression. This condition may affect your appearance and your ability or willingness to wear certain clothes  or do certain activities. How is this diagnosed? This condition is diagnosed by a health care provider who specializes in skin diseases (dermatologist). You may be diagnosed based on: Your symptoms and medical history. A physical exam. Testing a pus sample for infection. Blood tests. How is this treated? Your treatment will depend on how severe your symptoms are. The same treatment will not work for everybody with this condition. You may need to try several treatments to find what works best for you. Treatment may include: Cleaning and bandaging (dressing) your wounds as needed. Lifestyle changes, such as new skin care routines. Taking medicines, such as: Antibiotics. Acne medicines. Medicines to reduce the activity of the immune system. A diabetes medicine (metformin). Birth control pills, for women. Steroids to reduce swelling and pain. Working with a mental health care provider, if you experience emotional distress due to this condition. If you have severe symptoms that do not get better with medicine, you may need surgery. Surgery may involve: Using a laser to clear the skin and remove hair follicles. Opening and draining deep sores. Removing the areas of skin that are diseased and scarred. Follow these instructions at home: Medicines  Take over-the-counter and prescription medicines only as told by your health care provider. If you were prescribed an antibiotic medicine, take it as told by your health care provider. Do not stop taking the antibiotic even if your condition improves. Skin care If you have open wounds, cover them with a clean dressing as told by your health care provider. Keep wounds clean by washing them gently with soap and water when you bathe. Do not shave the areas where you get hidradenitis suppurativa. Do not wear deodorant. Wear loose-fitting   clothes. Try to avoid getting overheated or sweaty. If you get sweaty or wet, change into clean, dry clothes as soon  as you can. To help relieve pain and itchiness, cover sore areas with a warm, clean washcloth (warm compress) for 5-10 minutes as often as needed. If told by your health care provider, take a bleach bath twice a week: Fill your bathtub halfway with water. Pour in  cup of unscented household bleach. Soak in the tub for 5-10 minutes. Only soak from the neck down. Avoid water on your face and hair. Shower to rinse off the bleach from your skin. General instructions Learn as much as you can about your disease so that you have an active role in your treatment. Work closely with your health care provider to find treatments that work for you. If you are overweight, work with your health care provider to lose weight as recommended. Do not use any products that contain nicotine or tobacco, such as cigarettes and e-cigarettes. If you need help quitting, ask your health care provider. If you struggle with living with this condition, talk with your health care provider or work with a mental health care provider as recommended. Keep all follow-up visits as told by your health care provider. This is important. Where to find more information Hidradenitis Suppurativa Foundation, Inc.: https://www.hs-foundation.org/ American Academy of Dermatology: https://www.aad.org Contact a health care provider if you have: A flare-up of hidradenitis suppurativa. A fever or chills. Trouble controlling your symptoms at home. Trouble doing your daily activities because of your symptoms. Trouble dealing with emotional problems related to your condition. Summary Hidradenitis suppurativa is a long-term (chronic) skin disease. It is similar to a severe form of acne, but it affects areas of the body where acne would be unusual. The first symptoms are usually painful bumps in the skin, similar to pimples. The condition may only cause mild symptoms, or it may get worse over time (progress). If you have open wounds, cover them  with a clean dressing as told by your health care provider. Keep wounds clean by washing them gently with soap and water when you bathe. Besides skin care, treatment may include medicines, laser treatment, and surgery. This information is not intended to replace advice given to you by your health care provider. Make sure you discuss any questions you have with your health care provider. Document Revised: 01/26/2020 Document Reviewed: 01/26/2020 Elsevier Patient Education  2023 [employer] Inc.  

## 2021-08-01 NOTE — Progress Notes (Signed)
[place]  ? ?Needs to follow up in person at GYN /PC  ?Provided with information to find office for this. ?

## 2021-08-03 ENCOUNTER — Ambulatory Visit: Payer: Medicaid Other | Admitting: Critical Care Medicine

## 2021-10-14 ENCOUNTER — Encounter: Payer: Self-pay | Admitting: Emergency Medicine

## 2021-10-14 ENCOUNTER — Telehealth: Payer: Medicaid Other | Admitting: Emergency Medicine

## 2021-10-14 DIAGNOSIS — Z712 Person consulting for explanation of examination or test findings: Secondary | ICD-10-CM

## 2021-10-14 NOTE — Progress Notes (Signed)
Messaged pt about her reason for appt. Had STI testing at outside facility and pt was anxious to understand results. I encouraged her to f/u with testing clinic and she agreed, elects to cancel appt.

## 2022-02-15 IMAGING — US US OB < 14 WEEKS - US OB TV
1 series · 15 of 28 positions shown · non-contrast
Comparison: None.

CLINICAL DATA: Cramping

EXAM:
OBSTETRIC <14 WK US AND TRANSVAGINAL OB US
TECHNIQUE: Both transabdominal and transvaginal ultrasound examinations were
performed for complete evaluation of the gestation as well as the
maternal uterus, adnexal regions, and pelvic cul-de-sac.
Transvaginal technique was performed to assess early pregnancy.

[Series 1: us ob < 14 weeks - us ob tv · 54 acquisitions, 15 frames shown]
[im 1/54]
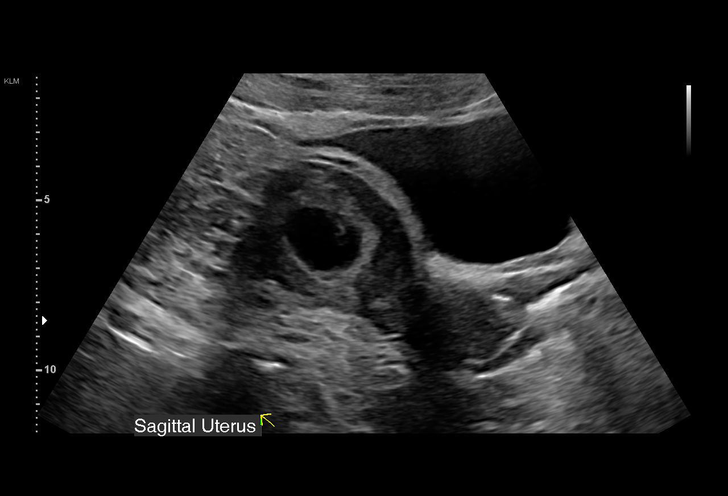
[im 4/54]
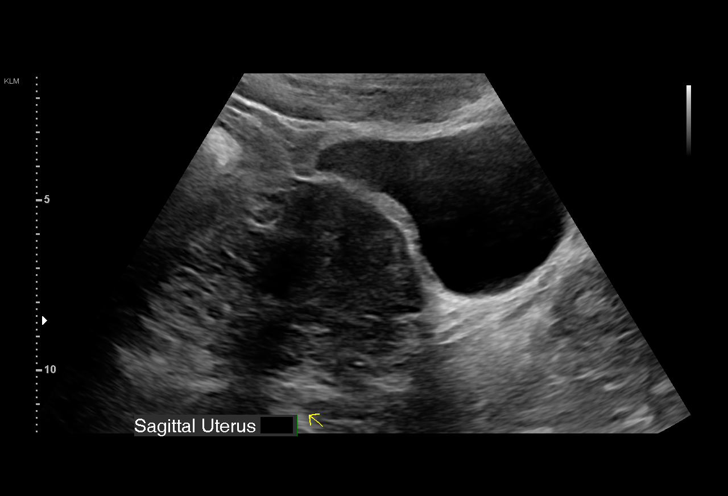
[im 8/54]
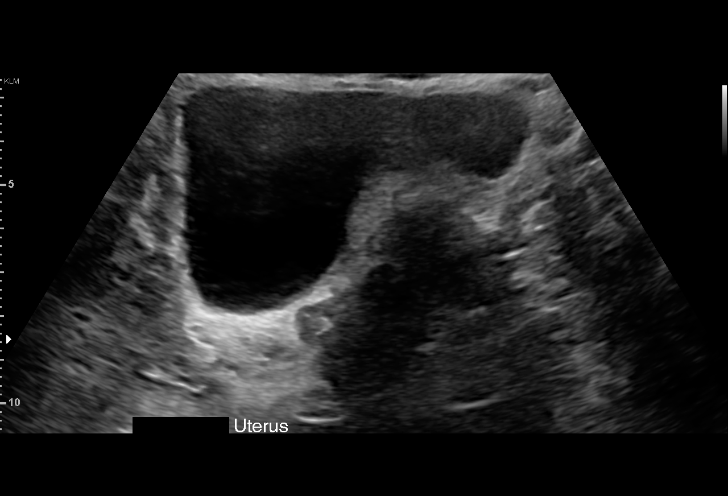
[im 12/54]
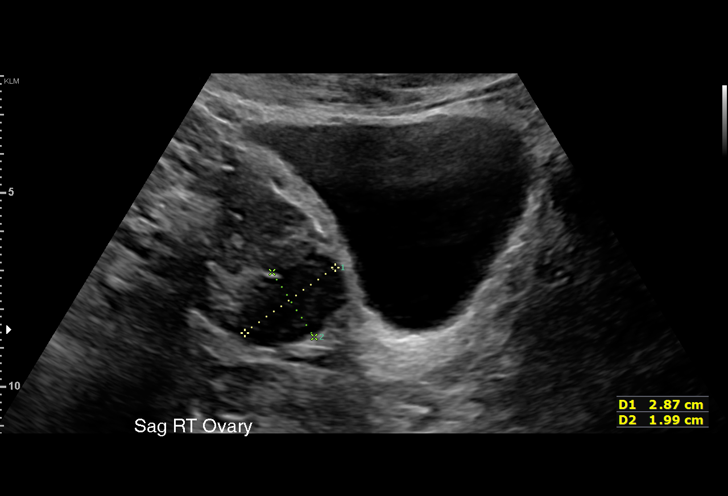
[im 16/54]
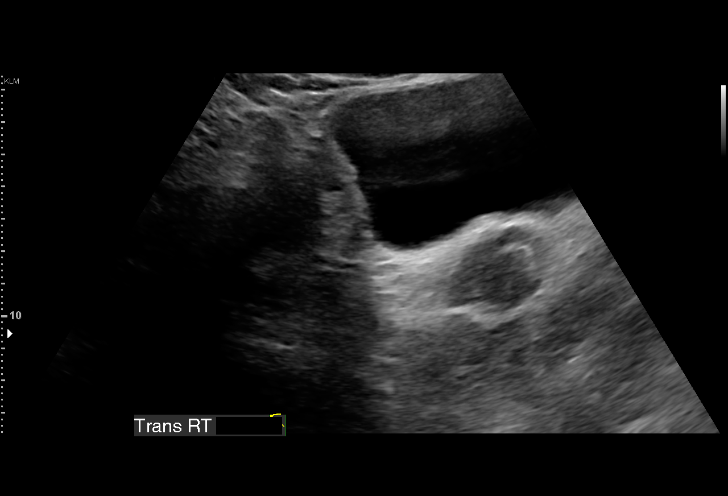
[im 20/54]
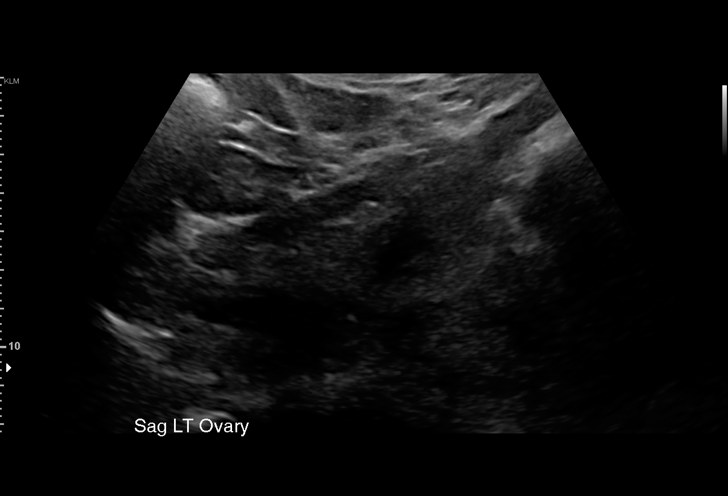
[im 24/54]
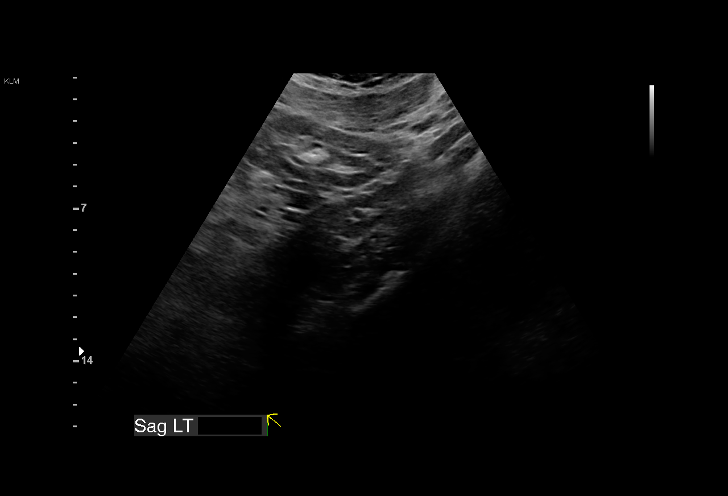
[im 28/54]
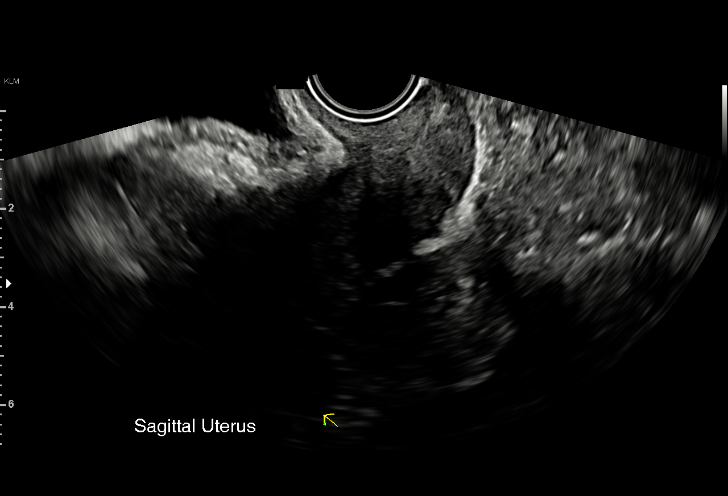
[im 30/54]
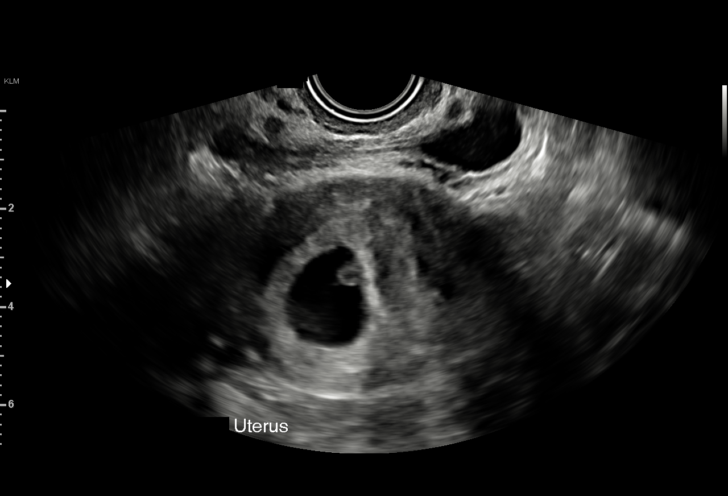
[im 34/54]
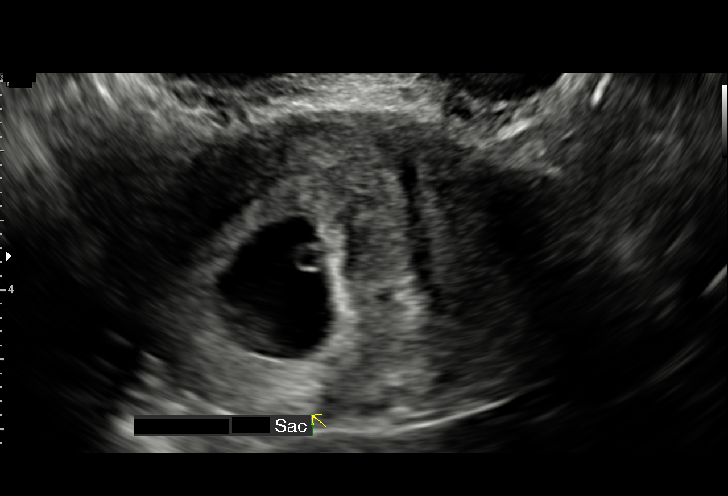
[im 38/54]
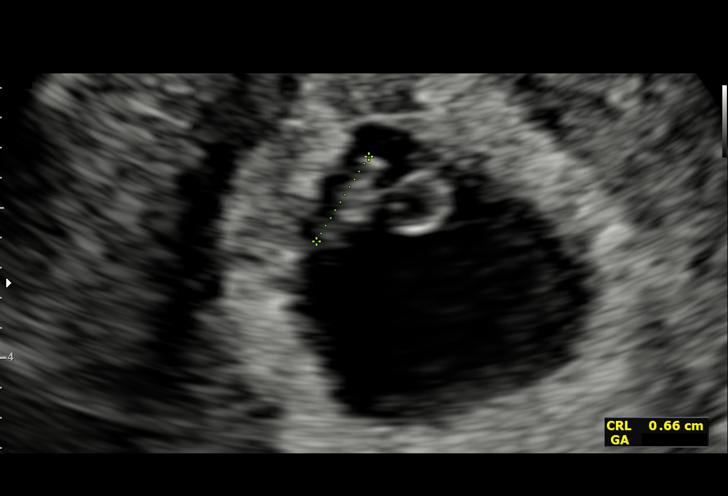
[im 42/54]
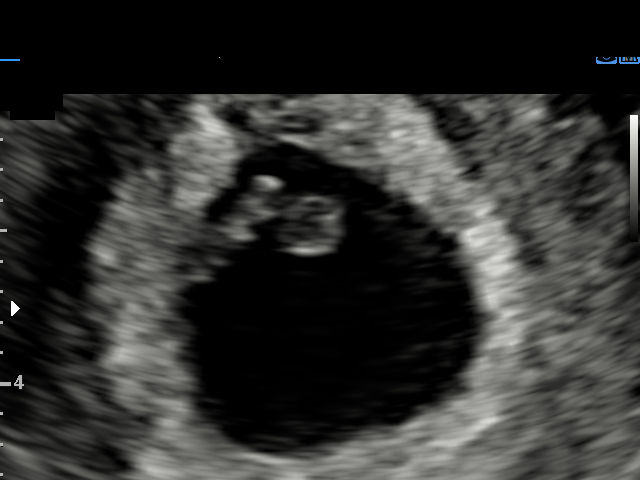
[im 46/54]
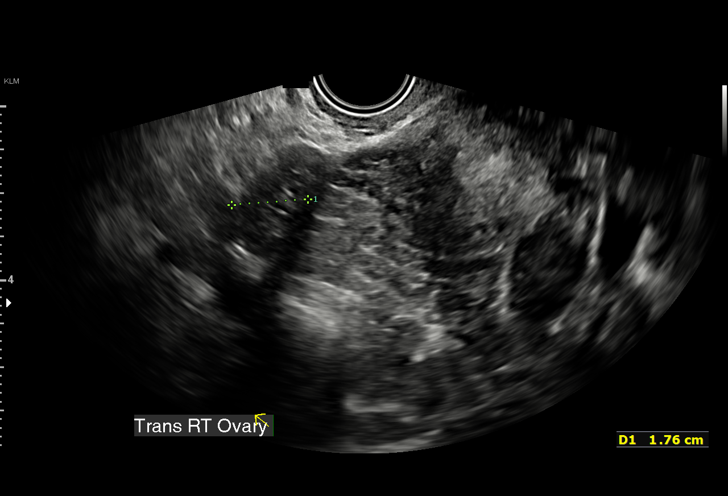
[im 50/54]
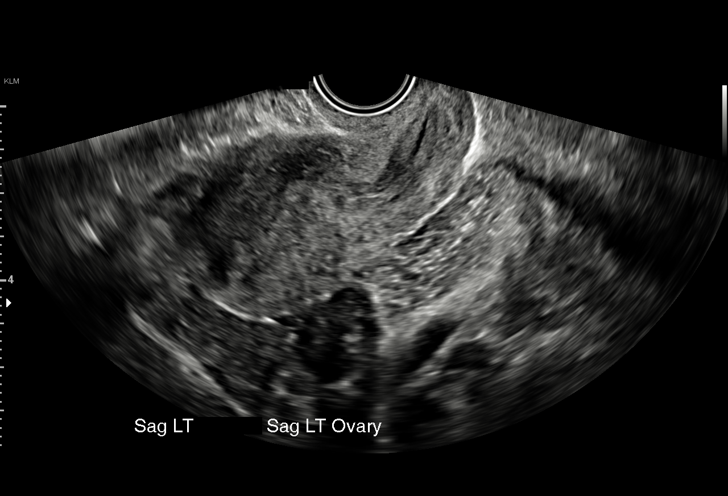
[im 54/54]
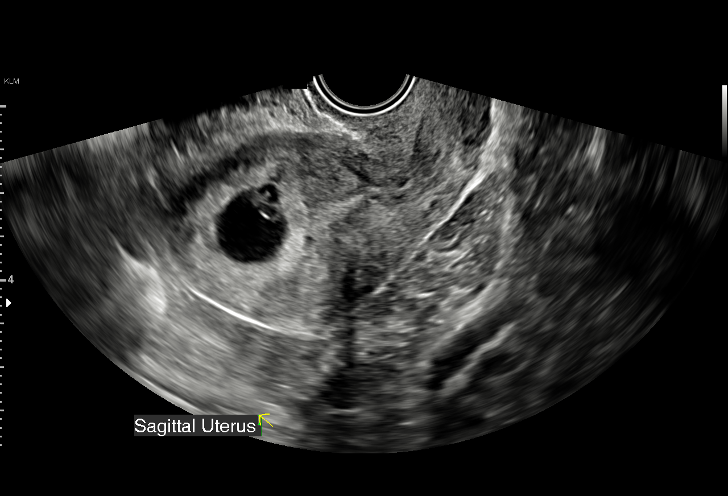

[15 of 28 positions shown; findings below may reference images not displayed]

FINDINGS: Intrauterine gestational sac: Single

Yolk sac:  Visualized.

Embryo:  Visualized.

Cardiac Activity: Visualized.

Heart Rate: 124 bpm

CRL:  7.3 mm   6 w   4 d                  US EDC: 01/21/2021

Subchorionic hemorrhage:  None visualized.

Maternal uterus/adnexae: Unremarkable.  No free fluid.
IMPRESSION: Single live intrauterine pregnancy as described.

## 2023-09-03 ENCOUNTER — Encounter
